# Patient Record
Sex: Male | Born: 1995
Health system: Southern US, Community
[De-identification: ages and names within clinical notes are randomized; demographics above are authoritative.]

## PROBLEM LIST (undated history)

## (undated) DIAGNOSIS — R519 Headache, unspecified: Secondary | ICD-10-CM

## (undated) DIAGNOSIS — C801 Malignant (primary) neoplasm, unspecified: Secondary | ICD-10-CM

## (undated) DIAGNOSIS — R51 Headache: Secondary | ICD-10-CM

## (undated) DIAGNOSIS — T8859XA Other complications of anesthesia, initial encounter: Secondary | ICD-10-CM

## (undated) DIAGNOSIS — T4145XA Adverse effect of unspecified anesthetic, initial encounter: Secondary | ICD-10-CM

---
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
## 2017-01-03 ENCOUNTER — Other Ambulatory Visit: Payer: Self-pay | Admitting: Pathology

## 2017-01-03 LAB — SURGICAL PATHOLOGY

## 2017-01-06 ENCOUNTER — Ambulatory Visit (INDEPENDENT_AMBULATORY_CARE_PROVIDER_SITE_OTHER): Payer: Medicaid Other | Admitting: Urology

## 2017-01-06 ENCOUNTER — Telehealth: Payer: Self-pay | Admitting: Family Medicine

## 2017-01-06 ENCOUNTER — Encounter: Payer: Self-pay | Admitting: Urology

## 2017-01-06 VITALS — BP 137/85 | HR 71 | Ht 70.0 in | Wt 212.4 lb

## 2017-01-06 DIAGNOSIS — C6212 Malignant neoplasm of descended left testis: Secondary | ICD-10-CM

## 2017-01-06 NOTE — Telephone Encounter (Signed)
Please review. Thanks!  

## 2017-01-06 NOTE — Progress Notes (Signed)
01/06/2017 11:52 AM   Manuel [name] Aug 20, 1995 [id number]  Referring provider: Birdie Sons, MD [address] [place], [place] [address]  Chief Complaint  Patient presents with  . Follow-up    Path results    HPI: The patient is a 21 year old male presents today after undergoing a left radical orchiectomy for left testicular mass.  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  Preoperative CT abdomen chest pelvis was negative.  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 Cancer ([place])   . Complication of anesthesia   . Headache    migraines    Surgical History: Past Surgical History:  Procedure Laterality Date  . ORCHIECTOMY Left 12/31/2016   Procedure: RADICAL INGUINAL ORCHIECTOMY;  Surgeon: Nickie Retort, MD;  Location: [facility] ORS;  Service: Urology;  Laterality: Left;  . TONSILLECTOMY AND ADENOIDECTOMY  2009    Home Medications:  Allergies as of 01/06/2017      Reactions   Other Other (See Comments)   Grass pollen      Medication List    as of 01/06/2017 11:52 AM   You have not been prescribed any medications.     Allergies:  Allergies  Allergen Reactions  . Other Other (See Comments)    Grass pollen    Family History: Family History  Problem Relation Age of Onset  . Healthy Mother   . Healthy Father   . Healthy Brother     Social History:  reports that he has been smoking E-cigarettes.  He has never used smokeless tobacco. He reports that he drinks alcohol. He reports that he does not use drugs.  ROS: UROLOGY Frequent Urination?: No Hard to postpone urination?: No Burning/pain with urination?: No Get up at night to urinate?: No Leakage of urine?: No Urine stream starts  and stops?: No Trouble starting stream?: No Do you have to strain to urinate?: No Blood in urine?: No Urinary tract infection?: No Sexually transmitted disease?: No Injury to kidneys or bladder?: No Painful intercourse?: No Weak stream?: No Erection problems?: No Penile pain?: No  Gastrointestinal Nausea?: No Vomiting?: No Indigestion/heartburn?: No Diarrhea?: No Constipation?: No  Constitutional Fever: No Night sweats?: Yes Weight loss?: No Fatigue?: No  Skin Skin rash/lesions?: No Itching?: No  Eyes Blurred vision?: No Double vision?: No  Ears/Nose/Throat Sore throat?: No Sinus problems?: No  Hematologic/Lymphatic Swollen glands?: No Easy bruising?: No  Cardiovascular Leg swelling?: No Chest pain?: No  Respiratory Cough?: No Shortness of breath?: No  Endocrine Excessive thirst?: No  Musculoskeletal Back pain?: No Joint pain?: No  Neurological Headaches?: No Dizziness?: No  Psychologic Depression?: No Anxiety?: No  Physical Exam: BP 137/85 (BP Location: Right Arm, Patient Position: Sitting, Cuff Size: Normal)   Pulse 71   Ht 5\' 10"  (1.778 m)   Wt 212 lb 6.4 oz (96.3 kg)   BMI 30.48 kg/m   Constitutional:  Alert and oriented, No acute distress. HEENT: [place] AT, moist mucus membranes.  Trachea midline, no masses. Cardiovascular: No clubbing, cyanosis, or edema. Respiratory: Normal respiratory effort, no increased work of breathing. GI: Abdomen is soft, nontender, nondistended, no abdominal masses GU: No CVA tenderness.  Left inguinal incision clean dry and intact.  No scrotal hematoma. Skin: No rashes, bruises or  suspicious lesions. Lymph: No cervical or inguinal adenopathy. Neurologic: Grossly intact, no focal deficits, moving all 4 extremities. Psychiatric: Normal mood and affect.  Laboratory Data: Lab Results  Component Value Date   WBC 7.8 12/28/2016   HGB 16.5 12/28/2016   HCT 49.3 12/28/2016   MCV 91 12/28/2016   PLT 240  12/28/2016    Lab Results  Component Value Date   CREATININE 0.85 12/28/2016    No results found for: PSA  No results found for: TESTOSTERONE  No results found for: HGBA1C  Urinalysis    Component Value Date/Time   COLORURINE YELLOW (A) 12/26/2016 0502   APPEARANCEUR Clear 12/27/2016 1400   LABSPEC 1.030 12/26/2016 0502   PHURINE 6.0 12/26/2016 0502   GLUCOSEU Negative 12/27/2016 1400   HGBUR NEGATIVE 12/26/2016 0502   BILIRUBINUR Negative 12/27/2016 1400   KETONESUR 20 (A) 12/26/2016 0502   PROTEINUR Negative 12/27/2016 1400   PROTEINUR 30 (A) 12/26/2016 0502   NITRITE Negative 12/27/2016 1400   NITRITE NEGATIVE 12/26/2016 0502   LEUKOCYTESUR Negative 12/27/2016 1400     Assessment & Plan:    1. 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 He does understand his final stage will depend on his postoperative tumor markers which cannot be drawn for at least another 20 days to be accurate.  If he is stage IA per NCCN guidelines, he will need either surveillance versus RPLND versus 1 cycle of BEP.  Surveillance is however preferred.  We went over the stage IA follow-up schedule which requires first follow-up for H&P and markers in 2 months and CT abdomen and pelvis at 4 months and chest x-ray at 4 months.  This to be followed by repeated routine close follow-up.  If he is stage IS meaning that his tumor markers do not normalize, he would be a candidate for BEP chemotherapy as he is considered good risk.  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  We will also arrange for this.  He will follow-up after having his tumor markers redrawn at least 25 days after surgery.   Return in about 1 month (around 02/06/2017) for Recheck tumor markers prior after at least 20 days from today. MRI prior.  Nickie Retort,  MD  [facility] [address] [place], [place] [address] [phone]

## 2017-01-06 NOTE — Telephone Encounter (Signed)
Pt's mother states she wanted to make sure you were aware the patient has been dxed with cancer and that is why he has not been able to f/u with Mikki Santee.

## 2017-01-06 NOTE — Telephone Encounter (Signed)
Tell her thanks-the records are appearing in his electronic chart.

## 2017-01-07 ENCOUNTER — Ambulatory Visit: Payer: Medicaid Other

## 2017-01-12 ENCOUNTER — Encounter: Payer: Self-pay | Admitting: Urology

## 2017-01-12 ENCOUNTER — Ambulatory Visit (INDEPENDENT_AMBULATORY_CARE_PROVIDER_SITE_OTHER): Payer: Medicaid Other | Admitting: Urology

## 2017-01-12 VITALS — BP 134/75 | HR 79 | Ht 70.0 in | Wt 214.4 lb

## 2017-01-12 DIAGNOSIS — C6212 Malignant neoplasm of descended left testis: Secondary | ICD-10-CM

## 2017-01-12 NOTE — Progress Notes (Signed)
01/12/2017 9:05 AM   Manuel [name] [birth year] [id number]  Referring provider: Birdie Sons, MD [address] [place], [place] [address]  Chief Complaint  Patient presents with  . Routine Post Op    HPI: The patient is a 21 year old gentleman who recently underwent left orchiectomy for left testicular mass.  He presents today for a wound check.  He is concerned that he felt a bulge at the incision site.  He notes the swelling was increased yesterday but has improved overnight.  He has no pain.  He does note itching of his incision.  He is having normal bowel movements and passing gas.  He has lifted heavier objects and has cleaned his house since he was last seen about a week ago.  He has no other concerns at this time.  Please see previous note dated January 06, 2017 for detailed discussion of his pathology and prognosis as well as further workup.   PMH: Past Medical History:  Diagnosis Date  . Cancer ([place])   . Complication of anesthesia   . Headache    migraines    Surgical History: Past Surgical History:  Procedure Laterality Date  . ORCHIECTOMY Left 12/31/2016   Procedure: RADICAL INGUINAL ORCHIECTOMY;  Surgeon: Nickie Retort, MD;  Location: [facility] ORS;  Service: Urology;  Laterality: Left;  . TONSILLECTOMY AND ADENOIDECTOMY  2009    Home Medications:  Allergies as of 01/12/2017      Reactions   Other Other (See Comments)   Grass pollen      Medication List    as of 01/12/2017  9:05 AM   You have not been prescribed any medications.     Allergies:  Allergies  Allergen Reactions  . Other Other (See Comments)    Grass pollen    Family History: Family History  Problem Relation Age of Onset  . Healthy Mother   . Healthy Father   . Healthy Brother     Social History:  reports that he has been smoking E-cigarettes.  He has never used smokeless tobacco. He reports that he drinks alcohol. He reports that he does not use  drugs.  ROS: UROLOGY Frequent Urination?: No Hard to postpone urination?: No Burning/pain with urination?: No Get up at night to urinate?: No Leakage of urine?: No Urine stream starts and stops?: No Trouble starting stream?: No Do you have to strain to urinate?: No Blood in urine?: No Urinary tract infection?: No Sexually transmitted disease?: No Injury to kidneys or bladder?: No Painful intercourse?: No Weak stream?: No Erection problems?: No Penile pain?: No  Gastrointestinal Nausea?: No Vomiting?: No Indigestion/heartburn?: No Diarrhea?: No Constipation?: No  Constitutional Fever: No Night sweats?: No Weight loss?: No Fatigue?: No  Skin Skin rash/lesions?: No Itching?: No  Eyes Blurred vision?: No Double vision?: No  Ears/Nose/Throat Sore throat?: No Sinus problems?: No  Hematologic/Lymphatic Swollen glands?: No Easy bruising?: No  Cardiovascular Leg swelling?: No Chest pain?: No  Respiratory Cough?: No Shortness of breath?: No  Endocrine Excessive thirst?: No  Musculoskeletal Back pain?: No Joint pain?: No  Neurological Headaches?: No Dizziness?: No  Psychologic Depression?: No Anxiety?: No  Physical Exam: BP 134/75 (BP Location: Right Arm, Patient Position: Sitting, Cuff Size: Normal)   Pulse 79   Ht 5\' 10"  (1.778 m)   Wt 214 lb 6.4 oz (97.3 kg)   BMI 30.76 kg/m   Constitutional:  Alert and oriented, No acute distress. HEENT: [place] AT, moist mucus membranes.  Trachea  midline, no masses. Cardiovascular: No clubbing, cyanosis, or edema. Respiratory: Normal respiratory effort, no increased work of breathing. GI: Abdomen is soft, nontender, nondistended, no abdominal masses GU: No CVA tenderness.  Left inguinal incision clean dry and intact.  Mild induration.  No evidence of hernia or infection. Skin: No rashes, bruises or suspicious lesions. Lymph: No cervical or inguinal adenopathy. Neurologic: Grossly intact, no focal deficits,  moving all 4 extremities. Psychiatric: Normal mood and affect.  Laboratory Data: Lab Results  Component Value Date   WBC 7.8 12/28/2016   HGB 16.5 12/28/2016   HCT 49.3 12/28/2016   MCV 91 12/28/2016   PLT 240 12/28/2016    Lab Results  Component Value Date   CREATININE 0.85 12/28/2016    No results found for: PSA  No results found for: TESTOSTERONE  No results found for: HGBA1C  Urinalysis    Component Value Date/Time   COLORURINE YELLOW (A) 12/26/2016 0502   APPEARANCEUR Clear 12/27/2016 1400   LABSPEC 1.030 12/26/2016 0502   PHURINE 6.0 12/26/2016 0502   GLUCOSEU Negative 12/27/2016 1400   HGBUR NEGATIVE 12/26/2016 0502   BILIRUBINUR Negative 12/27/2016 1400   KETONESUR 20 (A) 12/26/2016 0502   PROTEINUR Negative 12/27/2016 1400   PROTEINUR 30 (A) 12/26/2016 0502   NITRITE Negative 12/27/2016 1400   NITRITE NEGATIVE 12/26/2016 0502   LEUKOCYTESUR Negative 12/27/2016 1400    Assessment & Plan:    1.  Left testicular cancer The patient's incision is healing well.  I do not see any sign of hernia or infection at this time.  He will keep his previously scheduled appointment for post orchiectomy tumor markers in a few weeks.   Nickie Retort, MD  [facility] [address] [place], [place] [address] [phone]

## 2017-01-25 ENCOUNTER — Ambulatory Visit
Admission: RE | Admit: 2017-01-25 | Discharge: 2017-01-25 | Disposition: A | Discharge status: Home / Self Care | Payer: 59 | Source: Ambulatory Visit | Attending: Urology | Admitting: Urology

## 2017-01-25 DIAGNOSIS — C6212 Malignant neoplasm of descended left testis: Secondary | ICD-10-CM | POA: Diagnosis present

## 2017-01-25 MED ORDER — GADOBENATE DIMEGLUMINE 529 MG/ML IV SOLN
20.0000 mL | Freq: Once | INTRAVENOUS | Status: AC | PRN
  Administered 2017-01-25: 19 mL via INTRAVENOUS

## 2017-01-26 ENCOUNTER — Other Ambulatory Visit: Payer: 59

## 2017-01-26 DIAGNOSIS — C6212 Malignant neoplasm of descended left testis: Secondary | ICD-10-CM

## 2017-01-27 LAB — LACTATE DEHYDROGENASE: LDH: 171 IU/L (ref 121–224)

## 2017-01-27 LAB — AFP TUMOR MARKER: AFP, SERUM, TUMOR MARKER: 4 ng/mL (ref 0.0–8.3)

## 2017-01-27 LAB — BETA HCG QUANT (REF LAB): hCG Quant: <1 m[IU]/mL (ref 0–3)

## 2017-01-28 ENCOUNTER — Ambulatory Visit (INDEPENDENT_AMBULATORY_CARE_PROVIDER_SITE_OTHER): Payer: 59 | Admitting: Urology

## 2017-01-28 ENCOUNTER — Encounter: Payer: Self-pay | Admitting: Urology

## 2017-01-28 VITALS — BP 134/76 | HR 79 | Ht 72.0 in | Wt 214.0 lb

## 2017-01-28 DIAGNOSIS — C6212 Malignant neoplasm of descended left testis: Secondary | ICD-10-CM

## 2017-01-28 NOTE — Progress Notes (Signed)
01/28/2017 11:31 AM   Manuel [name] 1995-08-31 [id number]  Referring provider: Birdie Sons, MD [address] [place], [place] [address]  Chief Complaint  Patient presents with  . Follow-up    HPI: The patient is a 21 year old male presents today after undergoing a left radical orchiectomy for left testicular mass.  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  Preoperative CT abdomen chest pelvis was negative.  MRI brain which was ordered secondary to choriocarcinoma component was normal.  His postoperative tumor markers have now normalized.  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 Cancer ([place])   . Complication of anesthesia   . Headache    migraines    Surgical History: Past Surgical History:  Procedure Laterality Date  . RADICAL INGUINAL ORCHIECTOMY Left 12/31/2016   Performed by Nickie Retort, MD at [facility]  . TONSILLECTOMY AND ADENOIDECTOMY  2009    Home Medications:  Allergies as of 01/28/2017      Reactions   Other Other (See Comments)   Grass pollen      Medication List    as of 01/28/2017 11:31 AM   You have not been prescribed any medications.     Allergies:  Allergies  Allergen Reactions  . Other Other (See Comments)    Grass pollen    Family History: Family History  Problem Relation Age of Onset  . Healthy Mother   . Healthy Father   . Healthy Brother     Social History:  reports that he has been smoking e-cigarettes.  he has never used smokeless tobacco. He reports that he drinks alcohol. He reports that he does not use  drugs.  ROS: UROLOGY Frequent Urination?: No Hard to postpone urination?: No Burning/pain with urination?: No Get up at night to urinate?: No Leakage of urine?: No Urine stream starts and stops?: No Trouble starting stream?: No Do you have to strain to urinate?: No Blood in urine?: No Urinary tract infection?: No Sexually transmitted disease?: No Injury to kidneys or bladder?: No Painful intercourse?: No Weak stream?: No Erection problems?: No Penile pain?: No  Gastrointestinal Nausea?: No Vomiting?: No Indigestion/heartburn?: No Diarrhea?: No Constipation?: No  Constitutional Fever: No Night sweats?: No Weight loss?: No Fatigue?: No  Skin Skin rash/lesions?: No Itching?: No  Eyes Blurred vision?: No Double vision?: No  Ears/Nose/Throat Sore throat?: No Sinus problems?: No  Hematologic/Lymphatic Swollen glands?: No Easy bruising?: No  Cardiovascular Leg swelling?: No Chest pain?: No  Respiratory Cough?: No Shortness of breath?: No  Endocrine Excessive thirst?: No  Musculoskeletal Back pain?: No Joint pain?: No  Neurological Headaches?: No Dizziness?: No  Psychologic Depression?: No Anxiety?: No  Physical Exam: BP 134/76   Pulse 79   Ht 6' (1.829 m)   Wt 214 lb (97.1 kg)   BMI 29.02 kg/m   Constitutional:  Alert and oriented, No acute distress. HEENT: [place] AT, moist mucus membranes.  Trachea midline, no masses. Cardiovascular: No clubbing, cyanosis, or edema. Respiratory: Normal respiratory effort, no increased work of breathing. GI: Abdomen is soft, nontender, nondistended, no abdominal masses GU:  No CVA tenderness.  Skin: No rashes, bruises or suspicious lesions. Lymph: No cervical or inguinal adenopathy. Neurologic: Grossly intact, no focal deficits, moving all 4 extremities. Psychiatric: Normal mood and affect.  Laboratory Data: Lab Results  Component Value Date   WBC 7.8 12/28/2016   HGB 16.5 12/28/2016   HCT 49.3  12/28/2016   MCV 91 12/28/2016   PLT 240 12/28/2016    Lab Results  Component Value Date   CREATININE 0.85 12/28/2016    No results found for: PSA  No results found for: TESTOSTERONE  No results found for: HGBA1C  Urinalysis    Component Value Date/Time   COLORURINE YELLOW (A) 12/26/2016 0502   APPEARANCEUR Clear 12/27/2016 1400   LABSPEC 1.030 12/26/2016 0502   PHURINE 6.0 12/26/2016 0502   GLUCOSEU Negative 12/27/2016 1400   HGBUR NEGATIVE 12/26/2016 0502   BILIRUBINUR Negative 12/27/2016 1400   KETONESUR 20 (A) 12/26/2016 0502   PROTEINUR Negative 12/27/2016 1400   PROTEINUR 30 (A) 12/26/2016 0502   NITRITE Negative 12/27/2016 1400   NITRITE NEGATIVE 12/26/2016 0502   LEUKOCYTESUR Negative 12/27/2016 1400    Pertinent Imaging: MRI brain reviewed without evidence of disease  Assessment & Plan:    1. 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  He will need to follow-up in 2 months for an H&P and markers.  He will need an abdominal and pelvic CT every 4-6 months for the first year.  He will need a chest x-ray at 4 in 12 months.  His follow-up intervals will increase after 1 year per NCCN guidelines.  Return in about 2 months (around 03/30/2017) for afp, hcg, ldh prior.  Nickie Retort, MD  [facility] [address], [place], [place] [address] [phone]

## 2017-04-01 ENCOUNTER — Other Ambulatory Visit: Payer: 59

## 2017-04-01 DIAGNOSIS — C6212 Malignant neoplasm of descended left testis: Secondary | ICD-10-CM

## 2017-04-02 LAB — AFP TUMOR MARKER: AFP, Serum, Tumor Marker: 1.7 ng/mL (ref 0.0–8.3)

## 2017-04-02 LAB — BETA HCG QUANT (REF LAB): hCG Quant: <1 m[IU]/mL (ref 0–3)

## 2017-04-02 LAB — LACTATE DEHYDROGENASE: LDH: 164 IU/L (ref 121–224)

## 2017-04-07 ENCOUNTER — Ambulatory Visit: Payer: 59 | Admitting: Urology

## 2017-04-14 ENCOUNTER — Ambulatory Visit: Payer: 59 | Admitting: Urology

## 2017-04-14 ENCOUNTER — Encounter: Payer: Self-pay | Admitting: Urology

## 2017-04-29 ENCOUNTER — Encounter: Payer: Self-pay | Admitting: Urology

## 2017-04-29 ENCOUNTER — Ambulatory Visit (INDEPENDENT_AMBULATORY_CARE_PROVIDER_SITE_OTHER): Payer: 59 | Admitting: Urology

## 2017-04-29 VITALS — BP 124/79 | HR 65 | Ht 72.0 in | Wt 212.9 lb

## 2017-04-29 DIAGNOSIS — C6212 Malignant neoplasm of descended left testis: Secondary | ICD-10-CM

## 2017-04-29 NOTE — Progress Notes (Signed)
04/29/2017 1:29 PM   Vershawn A Martinique [birth year] [id number]  Referring provider: Birdie Sons, MD [address] [place], [place] [address]  Chief Complaint  Patient presents with  . Follow-up    HPI: The patient is a 22 year old male presents today for first tumor marker follow up after undergoing a left radical orchiectomy for left testicular mass. 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 Preoperative CT abdomen chest pelvis was negative.  MRI brain which was ordered secondary to small choriocarcinoma component which was normal.  His preoperative tumor markers were elevated but subsequent postoperative tumor markers normalized.  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   PMH: Past Medical History:  Diagnosis Date  . Cancer ([place])   . Complication of anesthesia   . Headache    migraines    Surgical History: Past Surgical History:  Procedure Laterality Date  . ORCHIECTOMY Left 12/31/2016   Procedure: RADICAL INGUINAL ORCHIECTOMY;  Surgeon: Nickie Retort, MD;  Location: [facility] ORS;  Service: Urology;  Laterality: Left;  . TONSILLECTOMY AND ADENOIDECTOMY  2009    Home Medications:  Allergies as of 04/29/2017      Reactions   Other Other (See Comments)   Grass pollen      Medication List    as of 04/29/2017  1:29 PM   You have not been prescribed any medications.      Allergies:  Allergies  Allergen Reactions  . Other Other (See Comments)    Grass pollen    Family History: Family History  Problem Relation Age of Onset  . Healthy Mother   . Healthy Father   . Healthy Brother     Social History:  reports that he has been smoking e-cigarettes.  he has never used smokeless tobacco. He reports that he drinks alcohol. He reports that he does not use drugs.  ROS: UROLOGY Frequent Urination?: No Hard to postpone urination?: No Burning/pain with urination?: No Get up at night to urinate?: No Leakage of urine?: No Urine stream starts and stops?: No Trouble starting stream?: No Do you have to strain to urinate?: No Blood in urine?: No Urinary tract infection?: No Sexually transmitted disease?: No Injury to kidneys or bladder?: No Painful intercourse?: No Weak stream?: No Erection problems?: No Penile pain?: No  Gastrointestinal Nausea?: No Vomiting?: No Indigestion/heartburn?: No Diarrhea?: No Constipation?: No  Constitutional Fever: No Night sweats?: No Weight loss?: No Fatigue?: No  Skin Skin rash/lesions?: No Itching?: No  Eyes Blurred vision?: No Double vision?: No  Ears/Nose/Throat Sore throat?: No Sinus problems?: No  Hematologic/Lymphatic Swollen glands?: No Easy bruising?: No  Cardiovascular Leg swelling?: No Chest pain?: No  Respiratory Cough?: No Shortness of breath?: No  Endocrine Excessive thirst?: No  Musculoskeletal Back pain?: No Joint pain?: No  Neurological Headaches?: No Dizziness?: No  Psychologic Depression?: No Anxiety?: No  Physical Exam: BP 124/79 (BP Location: Right Arm, Patient Position: Sitting, Cuff  Size: Large)   Pulse 65   Ht 6' (1.829 m)   Wt 212 lb 14.4 oz (96.6 kg)   BMI 28.87 kg/m   Constitutional:  Alert and oriented, No acute distress. HEENT: [place] AT, moist mucus membranes.  Trachea midline, no masses. Cardiovascular: No clubbing, cyanosis, or  edema. Respiratory: Normal respiratory effort, no increased work of breathing. GI: Abdomen is soft, nontender, nondistended, no abdominal masses GU: No CVA tenderness.  Left inguinal incision well-healed.  Normal right testicle without masses descended.  No inguinal lymphadenopathy.  No palpable abdominal masses.  No abnormalities on exam. Skin: No rashes, bruises or suspicious lesions. Lymph: No cervical or inguinal adenopathy. Neurologic: Grossly intact, no focal deficits, moving all 4 extremities. Psychiatric: Normal mood and affect.  Laboratory Data: Lab Results  Component Value Date   WBC 7.8 12/28/2016   HGB 16.5 12/28/2016   HCT 49.3 12/28/2016   MCV 91 12/28/2016   PLT 240 12/28/2016    Lab Results  Component Value Date   CREATININE 0.85 12/28/2016    No results found for: PSA  No results found for: TESTOSTERONE  No results found for: HGBA1C  Urinalysis    Component Value Date/Time   COLORURINE YELLOW (A) 12/26/2016 0502   APPEARANCEUR Clear 12/27/2016 1400   LABSPEC 1.030 12/26/2016 0502   PHURINE 6.0 12/26/2016 0502   GLUCOSEU Negative 12/27/2016 1400   HGBUR NEGATIVE 12/26/2016 0502   BILIRUBINUR Negative 12/27/2016 1400   KETONESUR 20 (A) 12/26/2016 0502   PROTEINUR Negative 12/27/2016 1400   PROTEINUR 30 (A) 12/26/2016 0502   NITRITE Negative 12/27/2016 1400   NITRITE NEGATIVE 12/26/2016 0502   LEUKOCYTESUR Negative 12/27/2016 1400    Assessment & Plan:    1. 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  He will need to follow-up in 2 months for an H&P and markers.  He will need an abdominal and pelvic CT every 4-6 months for the first year.  He will need a chest x-ray at 4 in 12 months. He will have both of these imaging studies prior to follow up as well since this will be the 4 month mark.  His follow-up intervals will increase after 1 year per NCCN guidelines.  I  again stressed with the patient the importance of regular follow-up to detect any early recurrence.   Return in about 2 months (around 06/27/2017) for labs, CT, chest xray prior.  Nickie Retort, MD  [facility] [address] [place], [place] [address] [phone]

## 2017-07-04 ENCOUNTER — Ambulatory Visit
Admission: RE | Admit: 2017-07-04 | Discharge: 2017-07-04 | Disposition: A | Discharge status: Home / Self Care | Payer: 59 | Source: Ambulatory Visit | Attending: Urology | Admitting: Urology

## 2017-07-04 DIAGNOSIS — C6212 Malignant neoplasm of descended left testis: Secondary | ICD-10-CM

## 2017-07-04 DIAGNOSIS — Z9079 Acquired absence of other genital organ(s): Secondary | ICD-10-CM | POA: Diagnosis not present

## 2017-07-04 MED ORDER — IOPAMIDOL (ISOVUE-300) INJECTION 61%
100.0000 mL | Freq: Once | INTRAVENOUS | Status: AC | PRN
  Administered 2017-07-04: 100 mL via INTRAVENOUS

## 2017-07-08 ENCOUNTER — Ambulatory Visit: Payer: 59

## 2017-07-22 ENCOUNTER — Encounter: Payer: Self-pay | Admitting: Urology

## 2017-07-22 ENCOUNTER — Ambulatory Visit (INDEPENDENT_AMBULATORY_CARE_PROVIDER_SITE_OTHER): Payer: 59 | Admitting: Urology

## 2017-07-22 VITALS — BP 121/75 | HR 73 | Ht 70.0 in | Wt 211.0 lb

## 2017-07-22 DIAGNOSIS — C6290 Malignant neoplasm of unspecified testis, unspecified whether descended or undescended: Secondary | ICD-10-CM | POA: Diagnosis not present

## 2017-07-22 DIAGNOSIS — R911 Solitary pulmonary nodule: Secondary | ICD-10-CM

## 2017-07-22 NOTE — Progress Notes (Signed)
07/22/2017 3:49 PM   Manuel [name] [birth year] [id number]  Referring provider: Birdie Sons, MD [address] [place], [place] [address]  Chief Complaint  Patient presents with  . Follow-up    ct scan and x-ray    HPI: The patient is a 22 year old male presents today for follow up after undergoing a left radical orchiectomy for left testicular mass. 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 Preoperative CT abdomen chest pelvis was negative.MRI brain which was orderedsecondary to small choriocarcinoma component was normal. His preoperative tumor markers were elevated but subsequent postoperative tumor markers normalized. This makes him a stage IAnonseminoma mixed germ cell tumor.  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  Today he follows up for his 71-month visit.  He underwent a CT abdomen which is unremarkable.  The chest x-ray did show an indeterminate 1.3 cm nodular density in the left lower lobe, and a CT of the chest was recommended by the radiologist.  His tumor markers for today's visit have not been drawn yet.  He has no complaints at this time.  He has lost about 10 pounds purposely by working out.  He still has a good appetite.  No lower extremity swelling, abdominal fullness, or pain.  No shortness of breath.  Patient notes no issues with erectile function or libido.  He reports that he is doing well overall.    PMH: Past Medical  History:  Diagnosis Date  . Cancer ([place])    testicular   . Complication of anesthesia   . Headache    migraines    Surgical History: Past Surgical History:  Procedure Laterality Date  . ORCHIECTOMY Left 12/31/2016   Procedure: RADICAL INGUINAL ORCHIECTOMY;  Surgeon: Nickie Retort, MD;  Location: [facility] ORS;  Service: Urology;  Laterality: Left;  . TONSILLECTOMY AND ADENOIDECTOMY  2009    Home Medications:  Allergies as of 07/22/2017      Reactions   Other Other (See Comments)   Grass pollen      Medication List    as of 07/22/2017  3:49 PM   You have not been prescribed any medications.     Allergies:  Allergies  Allergen Reactions  . Other Other (See Comments)    Grass pollen    Family History: Family History  Problem Relation Age of Onset  . Healthy Mother   . Healthy Father   . Healthy Brother     Social History:  reports that he has been smoking e-cigarettes.  He has never used smokeless tobacco. He reports that he drinks alcohol. He reports that he does not use drugs.  ROS: UROLOGY Frequent Urination?: No Hard to postpone urination?: No Burning/pain with urination?: No Get up at night to urinate?: No Leakage of urine?: No Urine stream starts and stops?: No Trouble starting stream?: No Do you have to strain to urinate?: No Blood in urine?: No Urinary tract infection?: No Sexually transmitted disease?: No Injury to kidneys or bladder?: No Painful intercourse?: No Weak  stream?: No Erection problems?: No Penile pain?: No  Gastrointestinal Nausea?: No Vomiting?: No Indigestion/heartburn?: No Diarrhea?: No Constipation?: No  Constitutional Fever: No Night sweats?: No Weight loss?: No Fatigue?: No  Skin Skin rash/lesions?: No Itching?: No  Eyes Blurred vision?: No Double vision?: No  Ears/Nose/Throat Sore throat?: No Sinus problems?: No  Hematologic/Lymphatic Swollen glands?: No Easy bruising?: No  Cardiovascular Leg  swelling?: No Chest pain?: No  Respiratory Cough?: No Shortness of breath?: No  Endocrine Excessive thirst?: No  Musculoskeletal Back pain?: No Joint pain?: No  Neurological Headaches?: No Dizziness?: No  Psychologic Depression?: No Anxiety?: No  Physical Exam: BP 121/75   Pulse 73   Ht 5\' 10"  (1.778 m)   Wt 211 lb (95.7 kg)   BMI 30.28 kg/m   Constitutional:  Alert and oriented, No acute distress. HEENT: [place] AT, moist mucus membranes.  Trachea midline, no masses. Cardiovascular: No clubbing, cyanosis, or edema. Respiratory: Normal respiratory effort, no increased work of breathing. GI: Abdomen is soft, nontender, nondistended, no abdominal masses GU: No CVA tenderness.  Normal phallus.  Right testicle descended.  Benign without nodules.  Left inguinal incision palpably normal.  Left hemiscrotum is vacant with no abnormalities.  No palpable lymphadenopathy in the groin region. Skin: No rashes, bruises or suspicious lesions. Lymph: No cervical or inguinal adenopathy. Neurologic: Grossly intact, no focal deficits, moving all 4 extremities. Psychiatric: Normal mood and affect.  Laboratory Data: Lab Results  Component Value Date   WBC 7.8 12/28/2016   HGB 16.5 12/28/2016   HCT 49.3 12/28/2016   MCV 91 12/28/2016   PLT 240 12/28/2016    Lab Results  Component Value Date   CREATININE 0.85 12/28/2016    No results found for: PSA  No results found for: TESTOSTERONE  No results found for: HGBA1C  Urinalysis    Component Value Date/Time   COLORURINE YELLOW (A) 12/26/2016 0502   APPEARANCEUR Clear 12/27/2016 1400   LABSPEC 1.030 12/26/2016 0502   PHURINE 6.0 12/26/2016 0502   GLUCOSEU Negative 12/27/2016 1400   HGBUR NEGATIVE 12/26/2016 0502   BILIRUBINUR Negative 12/27/2016 1400   KETONESUR 20 (A) 12/26/2016 0502   PROTEINUR Negative 12/27/2016 1400   PROTEINUR 30 (A) 12/26/2016 0502   NITRITE Negative 12/27/2016 1400   NITRITE NEGATIVE 12/26/2016 0502     LEUKOCYTESUR Negative 12/27/2016 1400    Pertinent Imaging: CT abdomen/pelvis and chest x-ray images and report reviewed as above.  Assessment & Plan:    1. Stage 1Anonseminoma mixed germ cell tumor The patient will continue active surveillance of his stage Ia nonseminomatous germ cell tumor.  He is due for tumor markers which will be drawn today. He will need to follow-up in 2 months for H&P and tumor markers. He will need an abdominal and pelvic CT every 4-6 months for the first year.  Due to the small nodule on chest x-ray, we will have him undergo a CT of his chest at this time.  Assuming this is normal,he will need a chest x-ray at 12 months. His follow-up intervals will increase after 1 year per NCCNguidelines.   Return in about 2 months (around 09/21/2017) for AFP, HCG, LDH prior.  Nickie Retort, MD  [facility] [address] [place], [place] [address] [phone]

## 2017-07-23 LAB — BETA HCG QUANT (REF LAB)

## 2017-07-23 LAB — AFP TUMOR MARKER: AFP, SERUM, TUMOR MARKER: 1.3 ng/mL (ref 0.0–8.3)

## 2017-07-23 LAB — LACTATE DEHYDROGENASE: LDH: 185 IU/L (ref 121–224)

## 2017-07-25 ENCOUNTER — Telehealth: Payer: Self-pay

## 2017-07-25 NOTE — Telephone Encounter (Signed)
-----   Message from Nickie Retort, MD sent at 07/25/2017 12:43 PM EDT ----- Please let patient know that his blood work showed no evidence of cancer. Thanks.   ----- Message ----- From: Royanne Foots, CMA Sent: 07/25/2017   8:27 AM To: Nickie Retort, MD    ----- Message ----- From: Interface, [facility] Lab Results In Sent: 07/23/2017   5:41 AM To: [facility]

## 2017-07-25 NOTE — Telephone Encounter (Signed)
Patient notified

## 2017-08-02 ENCOUNTER — Ambulatory Visit: Payer: 59 | Admitting: Urology

## 2017-08-05 ENCOUNTER — Ambulatory Visit
Admission: RE | Admit: 2017-08-05 | Discharge: 2017-08-05 | Disposition: A | Discharge status: Home / Self Care | Payer: 59 | Source: Ambulatory Visit | Attending: Urology | Admitting: Urology

## 2017-08-05 DIAGNOSIS — R911 Solitary pulmonary nodule: Secondary | ICD-10-CM

## 2017-08-05 DIAGNOSIS — R918 Other nonspecific abnormal finding of lung field: Secondary | ICD-10-CM | POA: Diagnosis not present

## 2017-08-05 MED ORDER — IOPAMIDOL (ISOVUE-300) INJECTION 61%
75.0000 mL | Freq: Once | INTRAVENOUS | Status: AC | PRN
  Administered 2017-08-05: 75 mL via INTRAVENOUS

## 2017-08-15 ENCOUNTER — Telehealth: Payer: Self-pay | Admitting: Urology

## 2017-08-15 NOTE — Telephone Encounter (Signed)
Patient notified

## 2017-08-15 NOTE — Telephone Encounter (Signed)
Patient's mom called asking for his CT results. She is on his DPR, they have been waiting for a while. He is Dr. Carlynn [name] patient and he was going to call with results. Patient has been throwing up blood and he is going to see his PCP this week. They just wanted to make [place] aware of this. Can you please call her or have the nurse call her at [phone] her name is Levada Dy.   Thanks, Sharyn Lull

## 2017-08-15 NOTE — Telephone Encounter (Signed)
Chest CT showed no abnormalities.  The nodule that was noted on his chest x-ray was not seen on the CT and the CT is a much more sensitive test.  Keep follow-up as scheduled.  See contact info below.

## 2017-08-16 ENCOUNTER — Ambulatory Visit (INDEPENDENT_AMBULATORY_CARE_PROVIDER_SITE_OTHER): Payer: 59 | Admitting: Family Medicine

## 2017-08-16 ENCOUNTER — Encounter: Payer: Self-pay | Admitting: Family Medicine

## 2017-08-16 VITALS — BP 102/68 | HR 60 | Temp 98.2°F | Resp 16 | Wt 203.2 lb

## 2017-08-16 DIAGNOSIS — R1013 Epigastric pain: Secondary | ICD-10-CM | POA: Diagnosis not present

## 2017-08-16 MED ORDER — DICYCLOMINE HCL 20 MG PO TABS: 20.0000 mg | ORAL_TABLET | Freq: Four times a day (QID) | ORAL | 0 refills | Status: DC

## 2017-08-16 NOTE — Progress Notes (Signed)
  Subjective:     Patient ID: Manuel [name], male   DOB: 1996-01-03, 22 y.o.   MRN: [id number] Chief Complaint  Patient presents with  . Abdominal Pain    Paitent comes in office today with concerns of upper abdominal pain x1 week. Patient descibes pain as burning and intermittent, there is no known trigger to pain and patient states that it is not relieved by anything. Patient reports that he has been under more stress but is unsure if related, patient states that he did have a episode of vomiting with blood mixed.    HPI Reports that he broke up with his girl friend before his symptoms began He is back with his old roommate and is starting to feel better. States appetite remains good and burning pain is neither triggered or ameliorated by meals. No change in bowel pattern. Currently followed by urology for testicular cancer (surgery only).  Review of Systems     Objective:   Physical Exam  Constitutional: He appears well-developed and well-nourished. No distress.  Abdominal: Bowel sounds are normal. There is no tenderness ( patient localized when sore to his epigastric area).       Assessment:    1. Epigastric pain: start Prevacid 15 mg. Two pills daily for 2-4 weeks. - dicyclomine (BENTYL) 20 MG: tablet; Take 1 tablet (20 mg total) by mouth every 6 (six) hours. As needed for stomach pain  Dispense: 20 tablet; Refill: 0    Plan:    Further f/u if not improving

## 2017-08-16 NOTE — Patient Instructions (Addendum)
Discussed taking two Prevacid daily for 2-4 weeks. May add the dicyclomine for abdominal pain/cramping. Let me know if not improving or new symptoms.

## 2017-09-21 ENCOUNTER — Other Ambulatory Visit: Payer: Self-pay

## 2017-09-23 ENCOUNTER — Ambulatory Visit: Payer: Self-pay

## 2017-09-27 ENCOUNTER — Other Ambulatory Visit: Payer: 59

## 2017-09-27 DIAGNOSIS — C6290 Malignant neoplasm of unspecified testis, unspecified whether descended or undescended: Secondary | ICD-10-CM

## 2017-09-28 LAB — BETA HCG QUANT (REF LAB): hCG Quant: <1 m[IU]/mL (ref 0–3)

## 2017-09-28 LAB — LACTATE DEHYDROGENASE: LDH: 162 IU/L (ref 121–224)

## 2017-09-28 LAB — AFP TUMOR MARKER: AFP, SERUM, TUMOR MARKER: 1.2 ng/mL (ref 0.0–8.3)

## 2017-10-11 ENCOUNTER — Ambulatory Visit: Payer: Self-pay | Admitting: Urology

## 2017-10-25 ENCOUNTER — Ambulatory Visit (INDEPENDENT_AMBULATORY_CARE_PROVIDER_SITE_OTHER): Payer: 59 | Admitting: Urology

## 2017-10-25 ENCOUNTER — Encounter: Payer: Self-pay | Admitting: Urology

## 2017-10-25 VITALS — BP 158/84 | HR 89 | Wt 200.0 lb

## 2017-10-25 DIAGNOSIS — C801 Malignant (primary) neoplasm, unspecified: Secondary | ICD-10-CM

## 2017-10-25 NOTE — Progress Notes (Signed)
10/25/2017 4:36 PM   Manuel [name] Oct 20, 1995 [id number]  Referring provider: Birdie Sons, [place] [address] [place], [place] [address]  Chief Complaint  Patient presents with  . Testicular Cancer    2 month follow up    HPI: 22 year old male with a personal history of mixed germ cell testicular cancer status post orchiectomy 12/2016 he returns today for routine surveillance.  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  He has been doing regular self testicular exams are unremarkable.  Most tumor markers in 09/2017 negative.    Most recent cross-sectional imaging on 07/04/2017 in the form of CT abdomen pelvis with contrast was negative for any metastatic disease.  He also had a possible nodule on chest x-ray at that same time and underwent follow-up chest CT on 08/05/2017 which is unremarkable.   PMH: Past Medical History:  Diagnosis Date  . Cancer ([place])    testicular   . Complication of anesthesia   . Headache    migraines    Surgical History: Past Surgical History:  Procedure Laterality Date  . ORCHIECTOMY Left 12/31/2016   Procedure: RADICAL INGUINAL ORCHIECTOMY;  Surgeon: Nickie Retort, MD;  Location: [facility] ORS;  Service: Urology;  Laterality: Left;  . TONSILLECTOMY AND ADENOIDECTOMY  2009    Home Medications:  Allergies as of 10/25/2017      Reactions   Other Other (See Comments)   Grass pollen      Medication List    as of 10/25/2017 11:59 PM   You have not been prescribed any medications.     Allergies:  Allergies  Allergen Reactions  .  Other Other (See Comments)    Grass pollen    Family History: Family History  Problem Relation Age of Onset  . Healthy Mother   . Healthy Father   . Healthy Brother     Social History:  reports that he quit smoking about 5 months ago. His smoking use included e-cigarettes. He has never used smokeless tobacco. He reports that he drinks alcohol. He reports that he does not use drugs.  ROS: UROLOGY Frequent Urination?: No Hard to postpone urination?: No Burning/pain with urination?: No Get up at night to urinate?: No Leakage of urine?: No Urine stream starts and stops?: No Trouble starting stream?: No Do you have to strain to urinate?: No Blood in urine?: No Urinary tract infection?: No Sexually transmitted disease?: No Injury to kidneys or bladder?: No Painful intercourse?: No Weak stream?: No Erection problems?: No Penile pain?: No  Gastrointestinal Nausea?: No Vomiting?: No Indigestion/heartburn?: No Diarrhea?: No Constipation?: No  Constitutional Fever: No Night sweats?: No Weight loss?: No Fatigue?: No  Skin Skin rash/lesions?: No Itching?: No  Eyes Blurred vision?: No Double vision?: No  Ears/Nose/Throat Sore throat?: No Sinus problems?: No  Hematologic/Lymphatic Swollen glands?: No Easy bruising?: No  Cardiovascular Leg swelling?: No Chest pain?: No  Respiratory Cough?: No Shortness of breath?: No  Endocrine Excessive thirst?: No  Musculoskeletal Back pain?: No Joint pain?: No  Neurological Headaches?: No Dizziness?: No  Psychologic Depression?: No Anxiety?: No  Physical Exam: BP Marland Kitchen)  158/84   Pulse 89   Wt 200 lb (90.7 kg)   BMI 28.70 kg/m   Constitutional:  Alert and oriented, No acute distress. HEENT: [place] AT, moist mucus membranes.  Trachea midline, no masses. Cardiovascular: No clubbing, cyanosis, or edema. Respiratory: Normal respiratory effort, no increased work of breathing. GI: Abdomen is soft, nontender,  nondistended, no abdominal masses.  Left subinguinal incision healing well. GU: Left testicle surgically absent.  Right testicle normal without masses or lesions.  Lymph: No inguinal lymphadenopathy. Skin: No rashes, bruises or suspicious lesions. Neurologic: Grossly intact, no focal deficits, moving all 4 extremities. Psychiatric: Normal mood and affect.  Laboratory Data: Lab Results  Component Value Date   WBC 7.8 12/28/2016   HGB 16.5 12/28/2016   HCT 49.3 12/28/2016   MCV 91 12/28/2016   PLT 240 12/28/2016    Lab Results  Component Value Date   CREATININE 0.85 12/28/2016   Labs 09/27/2017 AFP 1.0 Beta-hCG less than 1 LDH 12   Urinalysis N/a  Pertinent Imaging: CT abdomen pelvis with contrast as well as CT chest with contrast from 07/04/2017 as well as 08/05/2017 personally reviewed today.  No evidence of metastatic disease.  Assessment & Plan:    1. Stage 1Anonseminoma mixed germ cell tumor Up-to-date on active surveillance CT scan and labs recently reviewed Physical exam today is unremarkable He will be due for chest x-ray/CT scan again in 2 months as well as tumor markers Reviewed surveillance protocol as below  Return in about 2 months (around 12/25/2017) for  CT/ CXR  and tumor marker for in 2 months .  Hollice Espy, MD  [facility] [address], [place], [place] [address] [phone]

## 2017-12-27 ENCOUNTER — Ambulatory Visit: Payer: 59 | Admitting: Urology

## 2017-12-27 ENCOUNTER — Other Ambulatory Visit: Payer: 59

## 2017-12-27 ENCOUNTER — Encounter: Payer: Self-pay | Admitting: Urology

## 2018-01-05 ENCOUNTER — Ambulatory Visit: Admission: RE | Admit: 2018-01-05 | Payer: 59 | Source: Ambulatory Visit

## 2018-01-19 ENCOUNTER — Ambulatory Visit: Payer: 59 | Admitting: Urology

## 2018-01-24 ENCOUNTER — Ambulatory Visit: Payer: 59 | Admitting: Urology

## 2018-01-24 ENCOUNTER — Encounter: Payer: Self-pay | Admitting: Urology

## 2018-01-25 ENCOUNTER — Telehealth: Payer: Self-pay | Admitting: Urology

## 2018-01-25 NOTE — Telephone Encounter (Signed)
This patient was a no-show again today for clinic.  He is now failed to come to clinic 3 times in a row after being personally called each time, reminded of appointments, and assured [place] that he would come in.  He is also failed to have his imaging studies or lab work.  I am extremely concerned about this patient which was expressed to him.  He is at risk for recurrence and progression without proper surveillance.  At this point time, he is no longer a good candidate for active surveillance given his inability to comply with this.  I would like to discuss adjuvant treatment with him.  Please reach out to him again to see if he would like to reschedule.  Please reiterate the above message.  Hollice Espy, MD

## 2018-01-26 NOTE — Telephone Encounter (Signed)
Patient contacted and provided number for urology.

## 2018-01-26 NOTE — Telephone Encounter (Signed)
I just spoke with Liane Comber and he has assured me again that he will be here on Tuesday 01-31-18 @ 4:00 for his app with Dr. Erlene Quan. I tried to see about getting his CT scan prior due to the PA expiring on the 21st but he was unable to. Worse case I can contact the insurance and get an extension on that. I stress again the importance of him coming.   Thanks, Sharyn Lull

## 2018-01-30 ENCOUNTER — Telehealth: Payer: Self-pay | Admitting: Family Medicine

## 2018-01-30 NOTE — Telephone Encounter (Signed)
Pt's mother called stating she is returning a call to [place].

## 2018-01-31 ENCOUNTER — Ambulatory Visit (INDEPENDENT_AMBULATORY_CARE_PROVIDER_SITE_OTHER): Payer: 59 | Admitting: Urology

## 2018-01-31 ENCOUNTER — Encounter: Payer: Self-pay | Admitting: Urology

## 2018-01-31 DIAGNOSIS — C801 Malignant (primary) neoplasm, unspecified: Secondary | ICD-10-CM | POA: Diagnosis not present

## 2018-01-31 NOTE — Progress Notes (Signed)
01/31/2018 4:24 PM   Manuel [name] February 14, 1996 [id number]  Referring provider: Birdie Sons, MD [address] [place], [place] [address]  Chief Complaint  Patient presents with  . 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  He has no showed for 3 serial appointments and has not yet gotten his CT scan.  He reports that he has had significant emotional and psychosocial issues over the past several months.  His father who is a quadriplegic and illegal alien divorced his mother and returned back to [place].  He is no longer lot back in the country.  This is been devastating to [name] who describes his dad as "his everything".  He is tearful today as he discusses this.   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  He has been doing regular self testicular exams are unremarkable.  Is unchanged from last visit.  Most tumor markers in 09/2017 negative.    Markers repeated today.  Most recent cross-sectional imaging on 07/04/2017 in the form of CT abdomen pelvis with contrast was negative for any metastatic disease.  He also had a possible nodule on chest x-ray at that same time and underwent follow-up chest CT on 08/05/2017 which is unremarkable.   PMH: Past Medical History:  Diagnosis Date  . Cancer ([place])    testicular   . Complication of anesthesia   . Headache    migraines    Surgical History: Past Surgical History:    Procedure Laterality Date  . ORCHIECTOMY Left 12/31/2016   Procedure: RADICAL INGUINAL ORCHIECTOMY;  Surgeon: Nickie Retort, MD;  Location: [facility] ORS;  Service: Urology;  Laterality: Left;  . TONSILLECTOMY AND ADENOIDECTOMY  2009    Home Medications:  Allergies as of 01/31/2018      Reactions   Other Other (See Comments)   Grass pollen      Medication List    as of 01/31/2018  4:24 PM   You have not been prescribed any medications.     Allergies:  Allergies  Allergen Reactions  . Other Other (See Comments)    Grass pollen    Family History: Family History  Problem Relation Age of Onset  . Healthy Mother   . Healthy Father   . Healthy Brother     Social History:  reports that he quit smoking about 8 months ago. His smoking use included e-cigarettes. He has never used smokeless tobacco. He reports that he drinks alcohol. He reports that he does not use drugs.  ROS: UROLOGY Frequent Urination?: No Hard to postpone urination?: No Burning/pain with urination?: No Get up at night to urinate?: No Leakage of urine?: No Urine stream starts and stops?: No Trouble starting stream?: No Do you have to strain to urinate?: No Blood in urine?: No Urinary tract infection?: No Sexually transmitted disease?: No Injury to kidneys or bladder?: No Painful intercourse?: No Weak stream?: No Erection problems?: No Penile pain?: No  Gastrointestinal Nausea?: No  Vomiting?: No Indigestion/heartburn?: No Diarrhea?: No Constipation?: No  Constitutional Fever: No Night sweats?: No Weight loss?: No Fatigue?: No  Skin Skin rash/lesions?: No Itching?: No  Eyes Blurred vision?: No Double vision?: No  Ears/Nose/Throat Sore throat?: No Sinus problems?: No  Hematologic/Lymphatic Swollen glands?: No Easy bruising?: No  Cardiovascular Leg swelling?: No Chest pain?: No  Respiratory Cough?: No Shortness of breath?: No  Endocrine Excessive thirst?:  No  Musculoskeletal Back pain?: No Joint pain?: No  Neurological Headaches?: No Dizziness?: No  Psychologic Depression?: No Anxiety?: No  Physical Exam: BP 111/67   Pulse 76   Wt 206 lb (93.4 kg)   BMI 29.56 kg/m   Constitutional:  Alert and oriented, No acute distress. HEENT: [place] AT, moist mucus membranes.  Trachea midline, no masses. Cardiovascular: No clubbing, cyanosis, or edema. Respiratory: Normal respiratory effort, no increased work of breathing. GI: Abdomen is soft, nontender, nondistended, no abdominal masses GU: Normal solitary descended right testicle.  No masses.  Left testicle surgically absent.  Left inguinal incision well-healed. Lymph: No  inguinal lymphadenopathy. Skin: No rashes, bruises or suspicious lesions. Neurologic: Grossly intact, no focal deficits, moving all 4 extremities. Psychiatric: Normal mood and affect.  Laboratory Data: Lab Results  Component Value Date   WBC 7.8 12/28/2016   HGB 16.5 12/28/2016   HCT 49.3 12/28/2016   MCV 91 12/28/2016   PLT 240 12/28/2016    Lab Results  Component Value Date   CREATININE 0.85 12/28/2016   .  Assessment & Plan:    1. 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  We discussed risks/ benefits of each.  He was offered a referral to medical oncology as well as [facility] urology to discuss surgery further which cannot be done robotically which he declined.  He was able to verbalize the importance of timely follow-up.  He was offered emotional support  today.  Tumor markers drawn today.  He will obtain a CT scan later this week and we will call him with his results.  Based on surveillance protocol as below, he will be due for repeat imaging in 6 months, repeat exam with tumor markers in 3 months.  Patient understands this and is agreeable.  - Lactate dehydrogenase - AFP tumor marker - Beta hCG quant (ref lab)   Return in about 6 months (around 08/01/2018) for CT scan/ CXR.  Hollice Espy, MD  [facility] [address] [place], [place] [address] [phone]

## 2018-01-31 NOTE — Telephone Encounter (Signed)
See below. KW 

## 2018-02-01 ENCOUNTER — Telehealth: Payer: Self-pay | Admitting: *Deleted

## 2018-02-01 LAB — BETA HCG QUANT (REF LAB)

## 2018-02-01 LAB — LACTATE DEHYDROGENASE: LDH: 166 IU/L (ref 121–224)

## 2018-02-01 LAB — AFP TUMOR MARKER: AFP, SERUM, TUMOR MARKER: 1.5 ng/mL (ref 0.0–8.3)

## 2018-02-01 NOTE — Telephone Encounter (Signed)
-----   Message from Hollice Espy, MD sent at 02/01/2018  8:23 AM EST ----- Tumor markers are negative.  Awesome news!  I will call you with your CT scan results later this week.    Hollice Espy, MD

## 2018-02-02 ENCOUNTER — Ambulatory Visit
Admission: RE | Admit: 2018-02-02 | Discharge: 2018-02-02 | Disposition: A | Discharge status: Home / Self Care | Payer: 59 | Source: Ambulatory Visit | Attending: Urology | Admitting: Urology

## 2018-02-02 DIAGNOSIS — C801 Malignant (primary) neoplasm, unspecified: Secondary | ICD-10-CM

## 2018-02-02 DIAGNOSIS — Z8547 Personal history of malignant neoplasm of testis: Secondary | ICD-10-CM | POA: Diagnosis not present

## 2018-02-02 DIAGNOSIS — K760 Fatty (change of) liver, not elsewhere classified: Secondary | ICD-10-CM | POA: Diagnosis not present

## 2018-02-02 MED ORDER — IOPAMIDOL (ISOVUE-300) INJECTION 61%
100.0000 mL | Freq: Once | INTRAVENOUS | Status: AC | PRN
  Administered 2018-02-02: 100 mL via INTRAVENOUS

## 2018-02-03 ENCOUNTER — Telehealth: Payer: Self-pay

## 2018-02-03 NOTE — Telephone Encounter (Signed)
-----   Message from Hollice Espy, MD sent at 02/03/2018  8:29 AM EST ----- CT and CXR look great.  Thank you Liane Comber.  See you in 3 months.  Hollice Espy, MD

## 2018-02-03 NOTE — Telephone Encounter (Signed)
Patient notified

## 2018-02-06 NOTE — Telephone Encounter (Signed)
Notified patient as instructed, patient pleased. patient agrees  

## 2018-05-01 ENCOUNTER — Other Ambulatory Visit: Payer: Self-pay

## 2018-05-01 DIAGNOSIS — C6290 Malignant neoplasm of unspecified testis, unspecified whether descended or undescended: Secondary | ICD-10-CM

## 2018-05-02 ENCOUNTER — Ambulatory Visit (INDEPENDENT_AMBULATORY_CARE_PROVIDER_SITE_OTHER): Payer: 59 | Admitting: Urology

## 2018-05-02 ENCOUNTER — Encounter: Payer: Self-pay | Admitting: Urology

## 2018-05-02 VITALS — BP 124/77 | HR 78 | Ht 70.0 in | Wt 201.7 lb

## 2018-05-02 DIAGNOSIS — C6290 Malignant neoplasm of unspecified testis, unspecified whether descended or undescended: Secondary | ICD-10-CM | POA: Diagnosis not present

## 2018-05-02 DIAGNOSIS — C6212 Malignant neoplasm of descended left testis: Secondary | ICD-10-CM

## 2018-05-02 NOTE — Progress Notes (Signed)
05/02/2018 10:30 AM   Manuel [name] November 27, 1995 [id number]  Referring provider: Birdie Sons, MD [address] [place], [place] [address]  Chief Complaint  Patient presents with  . Follow-up    testicular cancer     HPI: Manuel [name] is a 23 y.o. male Caucasian with a personal history of mixed germ cell testicular cancer, status post left orchiectomy 12/2016.    He has repeatedly no showed, for 3 serial appointments, likely due to recent emotional and psychosocial issues due to his parents divorcing and his father returning to [place] with few visits back to the [place].  The pathology on his tumor is pT1a mixed germ cell tumor (74% embryonal carcinoma 10% yolk, sac, 1% choriocarcinoma, 15% teratoma), negative margins, negative spermatic cord margin, no LVI, 2.7 cm in size.  Preoperative tumor markers were AFP 231.2 and b-HCG 305, and postoperative tumor markers were AFP, b-HCG, and LDH all within normal limits.  Between his previous two visits, he had no issues including no weight loss, changes in his medical status, or any other concerns.  He had been doing regular self testicular exams, which were unremarkable.  Was unchanged from last visit.  Patient denies today (05/02/2018) any problems.  Patient reports good appetite no intentional weight loss.  Patient denies suprapubic, lower back, and flank pain.  Patient denies painful erections, curvature with erections.  Patient has been continuing to perform regular self testicular exams and has not noted any changes.    Patient's mood is good.  PMH: Past Medical History:  Diagnosis Date  . Cancer ([place])    testicular   . Complication of anesthesia   . Headache    migraines    Surgical History: Past Surgical History:  Procedure Laterality Date  . ORCHIECTOMY Left 12/31/2016   Procedure: RADICAL INGUINAL ORCHIECTOMY;  Surgeon: Nickie Retort, MD;  Location: [facility] ORS;  Service: Urology;  Laterality:  Left;  . TONSILLECTOMY AND ADENOIDECTOMY  2009    Home Medications:  Allergies as of 05/02/2018      Reactions   Other Other (See Comments)   Grass pollen      Medication List    as of May 02, 2018 11:59 PM   You have not been prescribed any medications.     Allergies:  Allergies  Allergen Reactions  . Other Other (See Comments)    Grass pollen    Family History: Family History  Problem Relation Age of Onset  . Healthy Mother   . Healthy Father   . Healthy Brother     Social History:  reports that he quit smoking about a year ago. His smoking use included e-cigarettes. He has never used smokeless tobacco. He reports current alcohol use. He reports that he does not use drugs.  ROS: UROLOGY Frequent Urination?: No Hard to postpone urination?: No Burning/pain with urination?: No Get up at night to urinate?: No Leakage of urine?: No Urine stream starts and stops?: No Trouble starting stream?: No Do you have to strain to urinate?: No Blood in urine?: No Urinary tract infection?: No Sexually transmitted disease?: No Injury to kidneys or bladder?: No Painful intercourse?: No Weak stream?: No Erection problems?: No Penile pain?: No  Gastrointestinal Nausea?: No Vomiting?: No Indigestion/heartburn?: No Diarrhea?: No Constipation?: No  Constitutional Fever: No Night sweats?: No Weight loss?: No Fatigue?: No  Skin Skin rash/lesions?: No Itching?: No  Eyes Blurred vision?: No Double vision?: No  Ears/Nose/Throat Sore throat?: No Sinus problems?:  No  Hematologic/Lymphatic Swollen glands?: No Easy bruising?: No  Cardiovascular Leg swelling?: No Chest pain?: No  Respiratory Cough?: No Shortness of breath?: No  Endocrine Excessive thirst?: No  Musculoskeletal Back pain?: No Joint pain?: No  Neurological Headaches?: No Dizziness?: No  Psychologic Depression?: No Anxiety?: No  Physical Exam: BP 124/77   Pulse 78   Ht 5'  10" (1.778 m)   Wt 201 lb 11.2 oz (91.5 kg)   SpO2 98%   BMI 28.94 kg/m   Constitutional:  Well nourished. Alert and oriented, No acute distress. Cardiovascular: No clubbing, cyanosis, or edema. Respiratory: Normal respiratory effort, no increased work of breathing. GU: No CVA tenderness.  No bladder fullness or masses.  Patient with circumcised phallus.  Urethral meatus is patent.  No penile discharge. No penile lesions or rashes. Scrotum without lesions, cysts, rashes and/or edema.  Left testicle is surgically absent.  Right testicle is located scrotally. No masses are appreciated in the right testicle.  Right epididymis is normal. Skin: No rashes, bruises or suspicious lesions. Neurologic: Grossly intact, no focal deficits, moving all 4 extremities. Psychiatric: Normal mood and affect.  Laboratory Data: Lab Results  Component Value Date   WBC 7.8 12/28/2016   HGB 16.5 12/28/2016   HCT 49.3 12/28/2016   MCV 91 12/28/2016   PLT 240 12/28/2016   Lab Results  Component Value Date   CREATININE 0.85 12/28/2016   Lab Results  Component Value Date   AST 20 12/28/2016   Lab Results  Component Value Date   ALT 33 12/28/2016   Urinalysis    Component Value Date/Time   COLORURINE YELLOW (A) 12/26/2016 0502   APPEARANCEUR Clear 12/27/2016 1400   LABSPEC 1.030 12/26/2016 0502   PHURINE 6.0 12/26/2016 0502   GLUCOSEU Negative 12/27/2016 1400   HGBUR NEGATIVE 12/26/2016 0502   BILIRUBINUR Negative 12/27/2016 1400   KETONESUR 20 (A) 12/26/2016 0502   PROTEINUR Negative 12/27/2016 1400   PROTEINUR 30 (A) 12/26/2016 0502   NITRITE Negative 12/27/2016 1400   NITRITE NEGATIVE 12/26/2016 0502   LEUKOCYTESUR Negative 12/27/2016 1400   I have reviewed the labs.  Assessment & Plan:    1. Testicular cancer - Patient is doing well -testicular tumor markers obtained today - will contact patient with his results  - RTC in 3 months for testicular tumor markers, CT, chest xray, and  appointment with Dr. Erlene Quan  Return in about 3 months (around 07/31/2018) for testicular tumor markers, CT, chest xray and appointment with Dr. Erlene Quan .  Zara Council, PA-C  [facility] [address] [place], [place] [address] [phone]  I, Adele Schilder, am acting as a scribe for [facility], PA-C   I have reviewed the above documentation for accuracy and completeness, and I agree with the above.    Zara Council, PA-C

## 2018-05-03 ENCOUNTER — Telehealth: Payer: Self-pay | Admitting: *Deleted

## 2018-05-03 LAB — BETA HCG QUANT (REF LAB): hCG Quant: <1 m[IU]/mL (ref 0–3)

## 2018-05-03 LAB — AFP TUMOR MARKER: AFP, Serum, Tumor Marker: 1.3 ng/mL (ref 0.0–8.3)

## 2018-05-03 LAB — LACTATE DEHYDROGENASE: LDH: 156 IU/L (ref 121–224)

## 2018-05-03 NOTE — Telephone Encounter (Signed)
-----   Message from Hollice Espy, MD sent at 05/03/2018  8:00 AM EST ----- Tumor markers negative.  Hollice Espy, MD

## 2018-05-03 NOTE — Telephone Encounter (Signed)
Notified patient as instructed, patient pleased. Discussed follow-up appointments, patient agrees  

## 2018-05-10 IMAGING — MR MR HEAD WO/W CM
11 series · 47 of 48 positions shown · IV contrast (multihance)
Comparison: CT chest abdomen and pelvis 12/28/2016

CLINICAL DATA: 21-year-old male with mixed histology testicular
cancer. Staging.

EXAM:
MRI HEAD WITHOUT AND WITH CONTRAST
TECHNIQUE: Multiplanar, multiecho pulse sequences of the brain and surrounding
structures were obtained without and with intravenous contrast.
CONTRAST:  19mL MULTIHANCE GADOBENATE DIMEGLUMINE 529 MG/ML IV SOLN

[Series 3: DWI · axial · 3.0mm · 1.20mm/px · z∈[-57,+102]mm · 4 of 55 slices shown (1 of 2)]
[im 1/55]
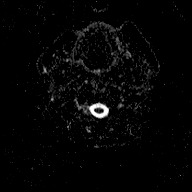
[im 19/55]
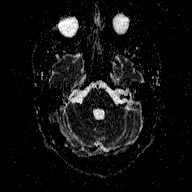
[im 37/55]
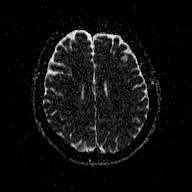
[im 55/55]
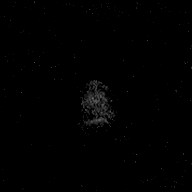

[Series 4: T1 · sagittal · 5.0mm · 0.45mm/px · 2 of 23 slices shown (1 of 2)]
[im 1/23]
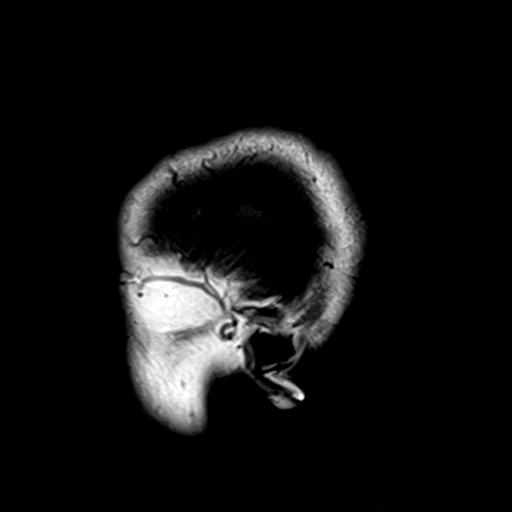
[im 23/23]
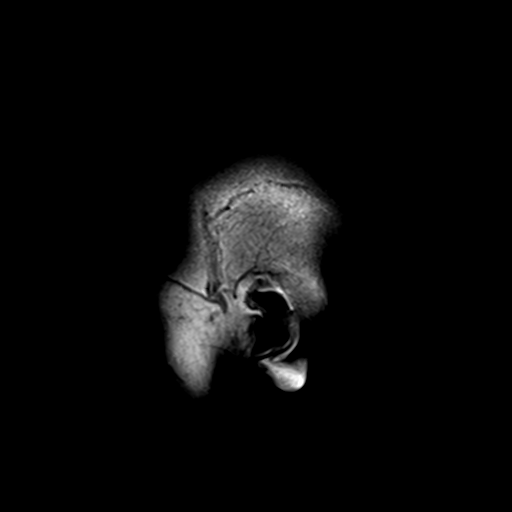

[Series 5: T2 · axial · 5.0mm · 0.72mm/px · z∈[-56,+102]mm · 2 of 24 slices shown (1 of 2)]
[im 1/24]
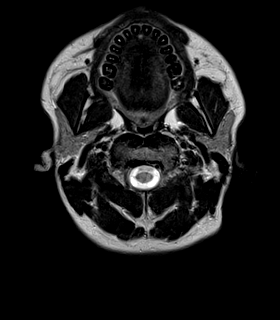
[im 24/24]
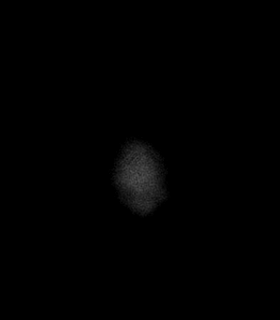

[Series 6: FLAIR · axial · 3.0mm · 0.45mm/px · z∈[-57,+102]mm · 4 of 55 slices shown]
[im 1/55]
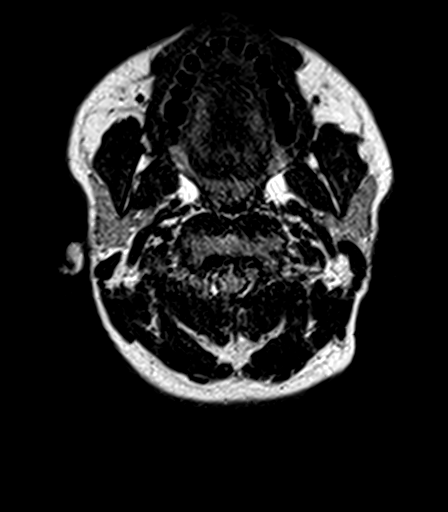
[im 19/55]
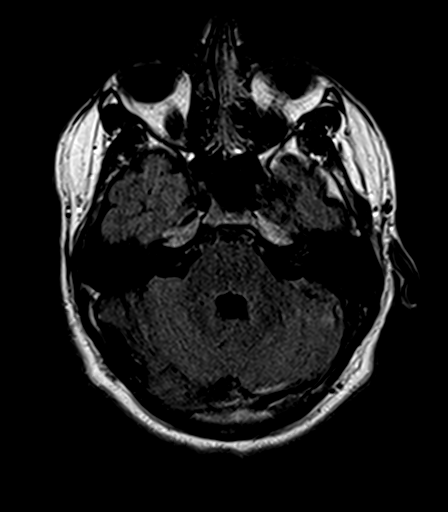
[im 37/55]
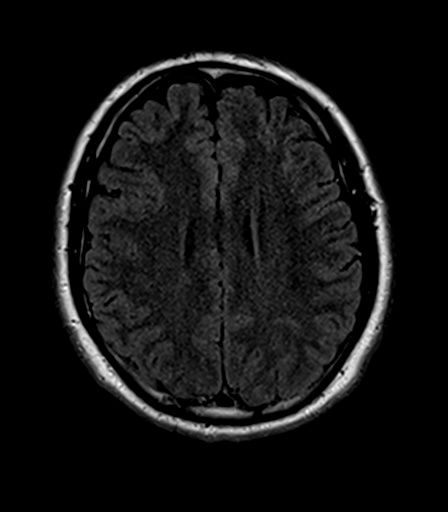
[im 55/55]
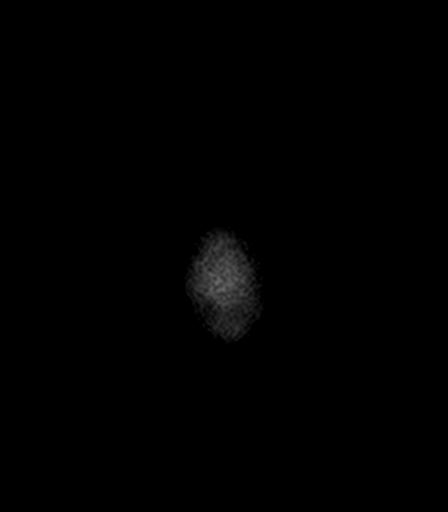

[Series 7: T2 · axial · 5.0mm · 0.72mm/px · z∈[-56,+102]mm · 2 of 24 slices shown (2 of 2)]
[im 1/24]
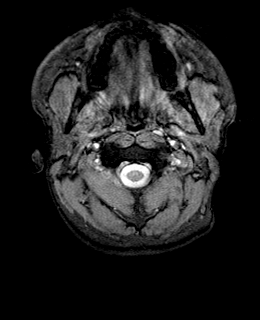
[im 24/24]
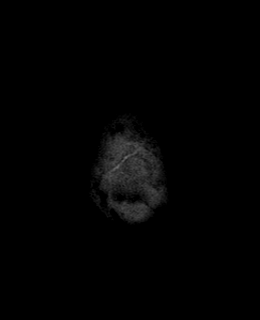

[Series 8: T1 · axial · 1.0mm · 1.00mm/px · z∈[-58,+98]mm · 11 of 160 slices shown (2 of 2)]
[im 1/160]
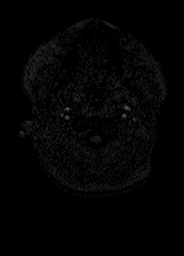
[im 15/160]
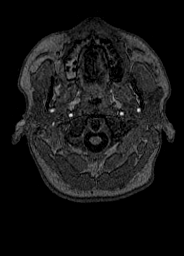
[im 29/160]
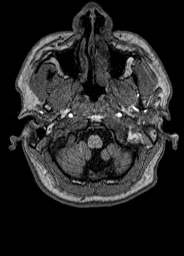
[im 44/160]
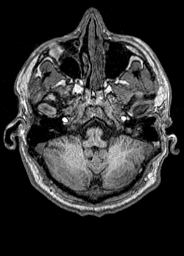
[im 58/160]
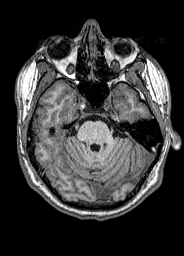
[im 73/160]
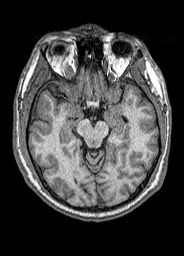
[im 87/160]
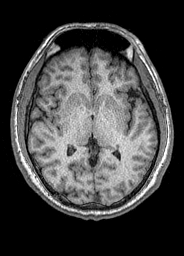
[im 102/160]
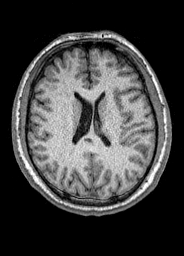
[im 116/160]
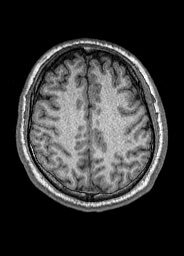
[im 131/160]
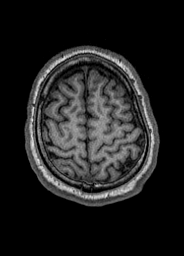
[im 160/160]
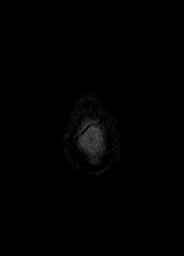

[Series 9: T2 post-contrast · coronal · 5.0mm · 0.45mm/px · 2 of 29 slices shown]
[im 1/29]
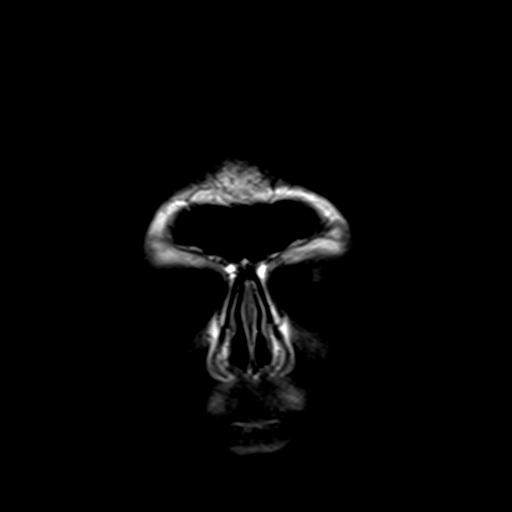
[im 29/29]
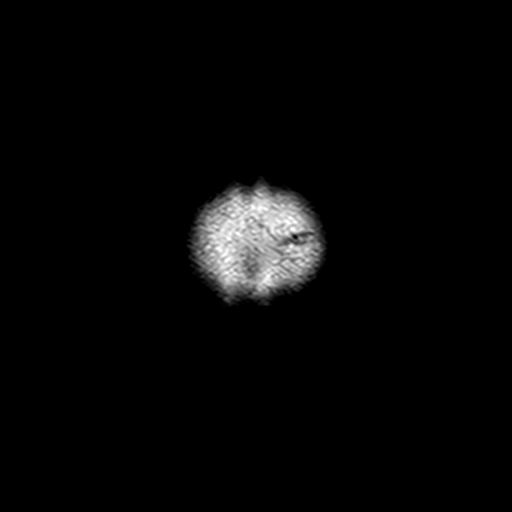

[Series 10: T1 post-contrast · axial · 1.0mm · 1.00mm/px · z∈[-58,+98]mm · 12 of 160 slices shown (1 of 3)]
[im 1/160]
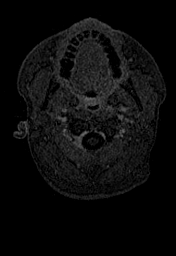
[im 15/160]
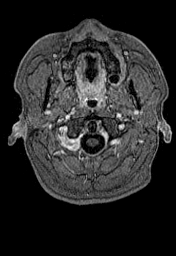
[im 29/160]
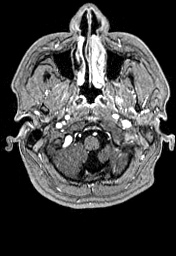
[im 44/160]
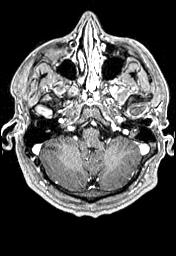
[im 58/160]
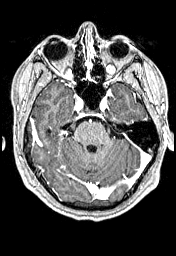
[im 73/160]
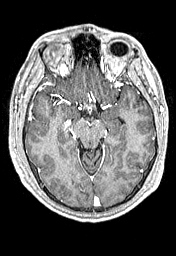
[im 87/160]
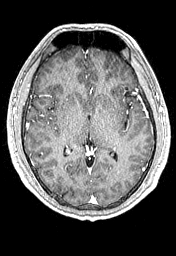
[im 102/160]
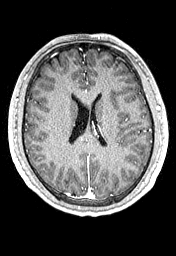
[im 116/160]
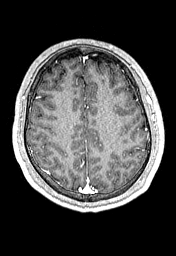
[im 131/160]
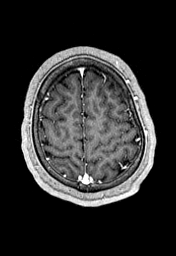
[im 145/160]
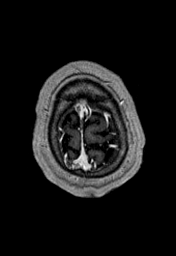
[im 160/160]
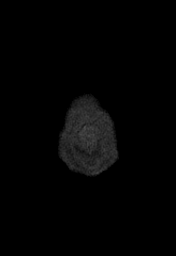

[Series 11: T1 post-contrast · coronal · 5.0mm · 0.45mm/px · 2 of 29 slices shown (2 of 3)]
[im 1/29]
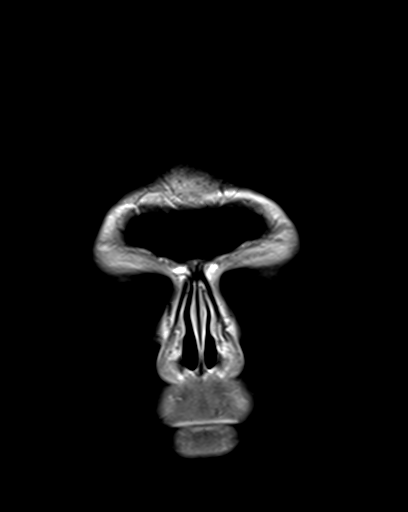
[im 29/29]
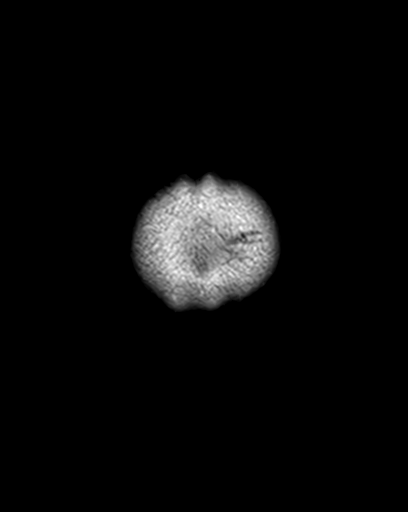

[Series 12: T1 post-contrast · sagittal · 5.0mm · 0.45mm/px · 2 of 23 slices shown (3 of 3)]
[im 1/23]
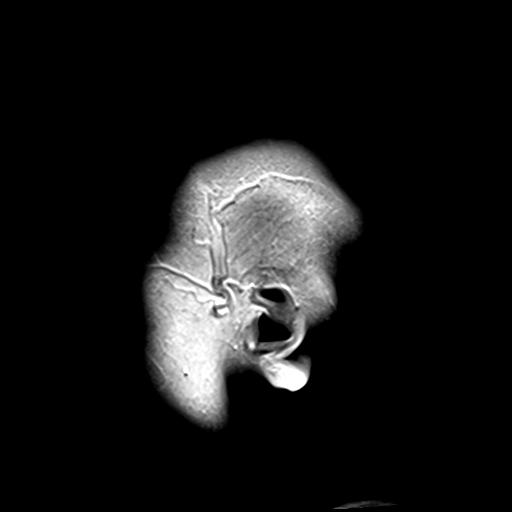
[im 23/23]
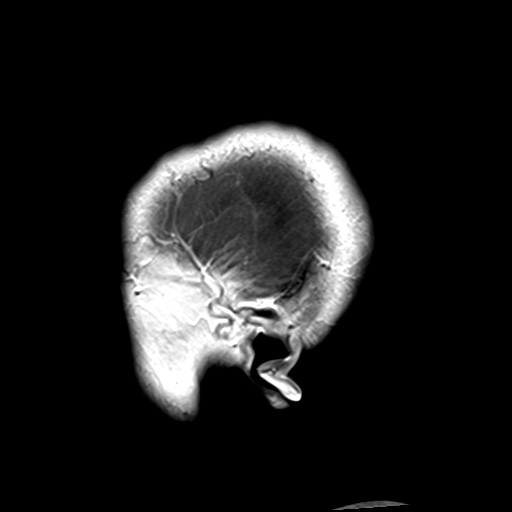

[Series 100: DWI · axial · 3.0mm · 1.20mm/px · z∈[-57,+102]mm · 4 of 55 slices shown (2 of 2)]
[im 1/55]
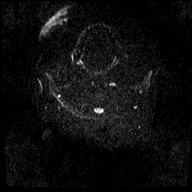
[im 19/55]
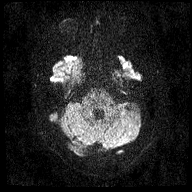
[im 37/55]
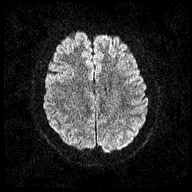
[im 55/55]
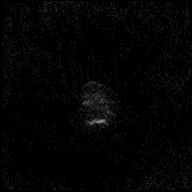

[47 of 48 positions shown; findings below may reference images not displayed]

FINDINGS: Brain: No abnormal enhancement identified. No midline shift, mass
effect, or evidence of intracranial mass lesion.

No definite abnormal dural thickening; smooth linear enhancement
along the ventral surface of the brainstem and inferior cerebellum
on series 12, image 12 is felt to be related to physiologic
vasculature.

Normal cerebral volume. Mild asymmetry of the lateral ventricle size
appears to be a normal variant. No ventriculomegaly. No restricted
diffusion to suggest acute infarction. Noextra-axial collection or
acute intracranial hemorrhage. Cervicomedullary junction within
normal limits. Pituitary size is at the upper limits of normal.

Gray and white matter signal is within normal limits throughout the
brain. No encephalomalacia or chronic cerebral blood products.

Vascular: Major intracranial vascular flow voids are preserved.

Skull and upper cervical spine: Negative visualized cervical spine
and spinal cord. Normal visible bone marrow signal.

Sinuses/Orbits: Orbits soft tissues are mildly degraded by motion
but appear normal. Paranasal sinuses are clear.

Other: Visible internal auditory structures appear normal. Mastoid
air cells are clear. Scalp and face soft tissues appear normal.
IMPRESSION: No metastatic disease or acute intracranial abnormality. Normal MRI
appearance of the brain.

## 2018-07-31 ENCOUNTER — Ambulatory Visit: Admission: RE | Admit: 2018-07-31 | Payer: 59 | Source: Ambulatory Visit

## 2018-08-02 ENCOUNTER — Ambulatory Visit: Payer: 59 | Admitting: Urology

## 2018-08-03 ENCOUNTER — Ambulatory Visit: Payer: 59 | Admitting: Urology

## 2018-09-04 ENCOUNTER — Telehealth: Payer: Self-pay | Admitting: Urology

## 2018-09-04 NOTE — Telephone Encounter (Signed)
We have made several attempts to reach the patient and his mother regarding his upcoming appt and his scans. He has not returned to calls and we have not been able to get him rescheduled for his CT scans. They have now expired and will need another PA. His app is 09-05-18 please advise on what you would like to do? Not sure if he is going to come.   Sharyn Lull

## 2018-09-04 NOTE — Telephone Encounter (Signed)
We will continue to hope that if comes to his next appointment.  If he does not, will send another certified letter.  We continue to have this issue with him.  Hollice Espy, MD

## 2018-09-05 ENCOUNTER — Ambulatory Visit: Payer: 59 | Admitting: Urology

## 2018-09-05 ENCOUNTER — Encounter: Payer: Self-pay | Admitting: Urology

## 2018-09-08 ENCOUNTER — Telehealth: Payer: Self-pay | Admitting: Urology

## 2018-09-08 ENCOUNTER — Encounter: Payer: Self-pay | Admitting: Family Medicine

## 2018-09-08 NOTE — Telephone Encounter (Signed)
Dr. Erlene Quan would like for you to send him a certified letter since he no showed for his app on 09-05-18 and did not get his CT scans. She wants it documented that we did reach out to him.  Thanks, Sharyn Lull

## 2018-09-08 NOTE — Telephone Encounter (Signed)
Letter has been sent

## 2018-10-17 IMAGING — CT CT ABD-PELV W/ CM
2 of 4 series · 16 of 46 positions shown, 18 images · IV contrast (iopamidol)
Comparison: 12/28/2016

CLINICAL DATA: Followup left testicular carcinoma. Previous left
radical orchiectomy.

EXAM:
CT ABDOMEN AND PELVIS WITH CONTRAST
TECHNIQUE: Multidetector CT imaging of the abdomen and pelvis was performed
using the standard protocol following bolus administration of
intravenous contrast.
CONTRAST:  100mL [id number] IOPAMIDOL ([id number]) INJECTION 61%

[Series 2: abd pelvis · axial · 0.76mm/px · z∈[-1671,-1201]mm · 13 of 102 slices shown, 15 images (1 of 2)]
[im 4/102  soft-tissue]
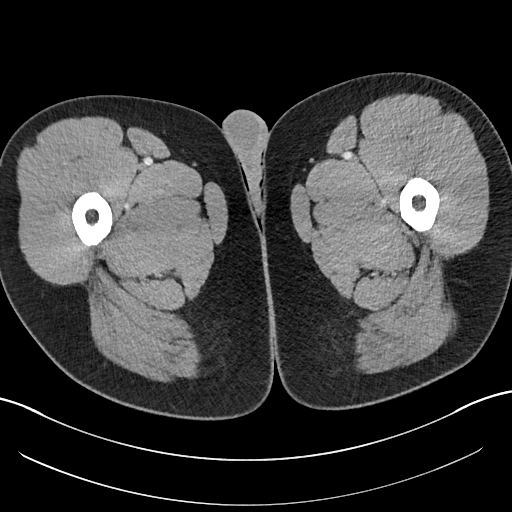
[im 4/102  bone]
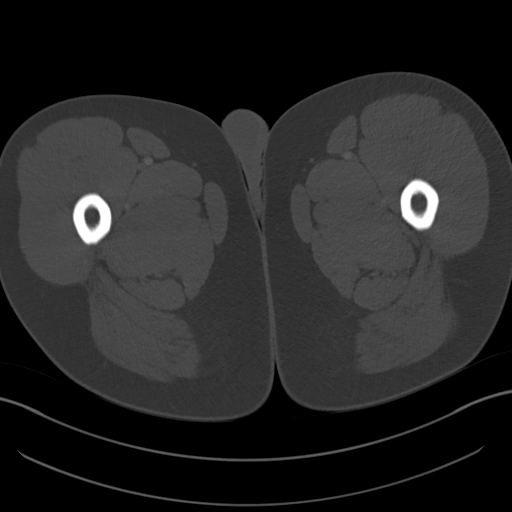
[im 12/102  soft-tissue]
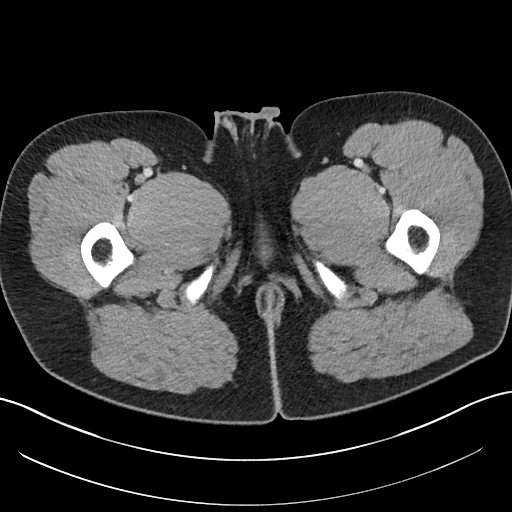
[im 20/102  soft-tissue]
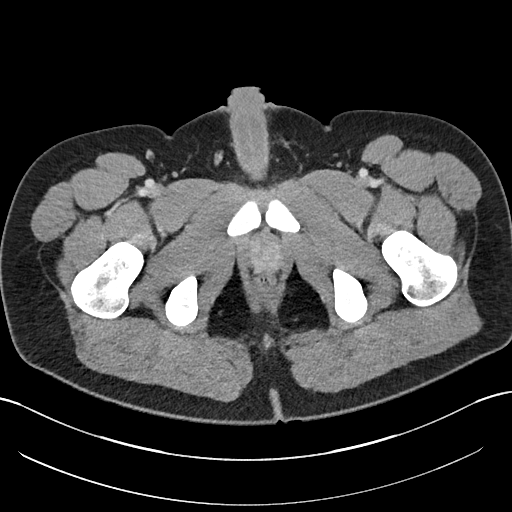
[im 28/102  soft-tissue]
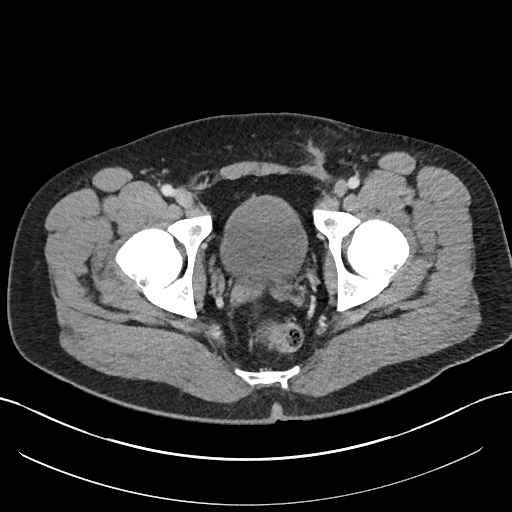
[im 35/102  soft-tissue]
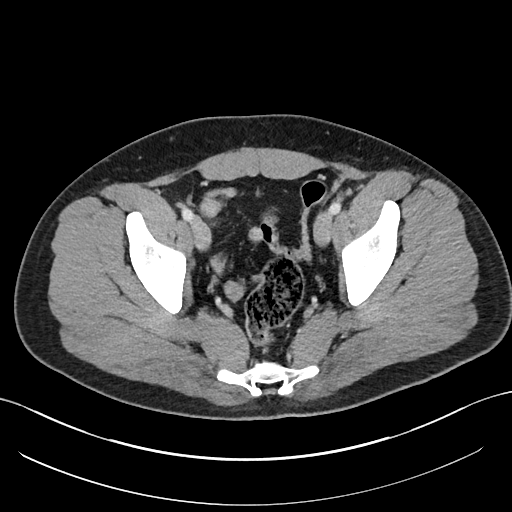
[im 43/102  soft-tissue]
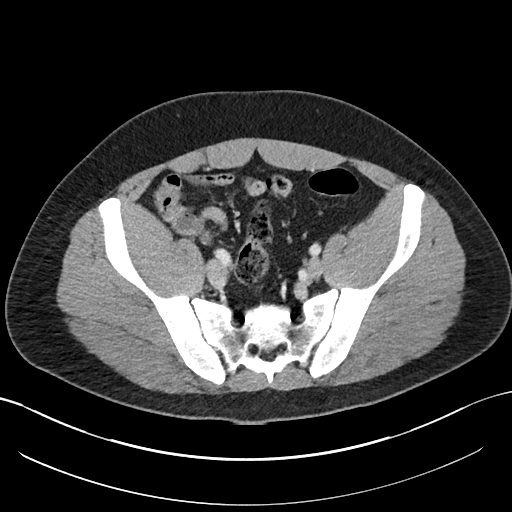
[im 51/102  soft-tissue]
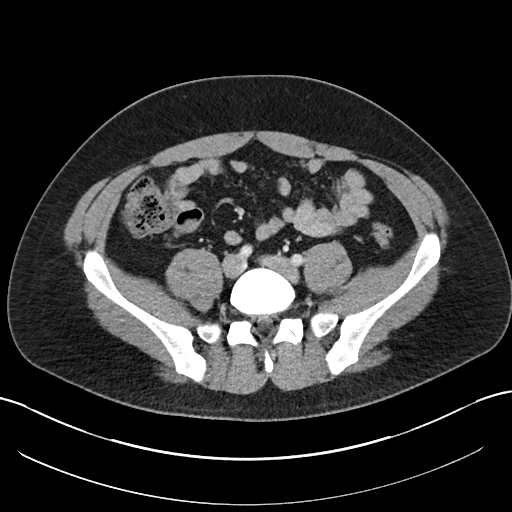
[im 59/102  soft-tissue]
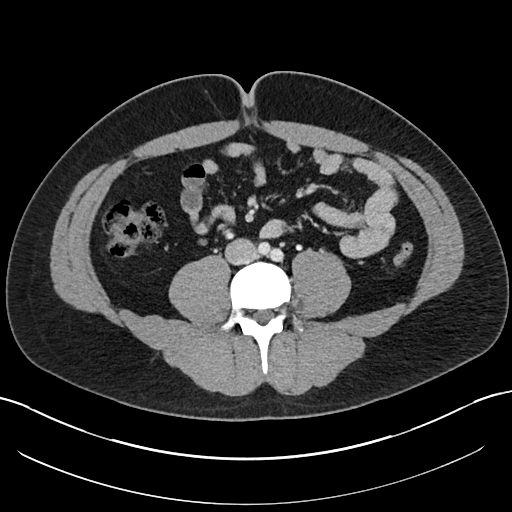
[im 67/102  soft-tissue]
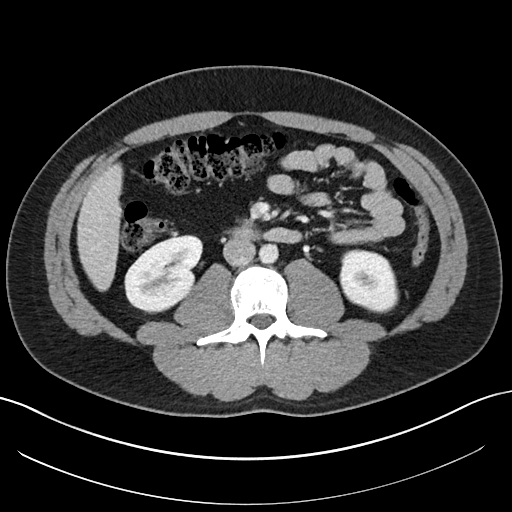
[im 67/102  bone]
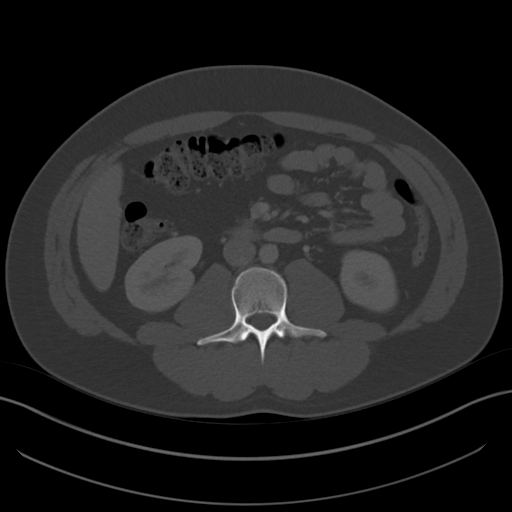
[im 74/102  soft-tissue]
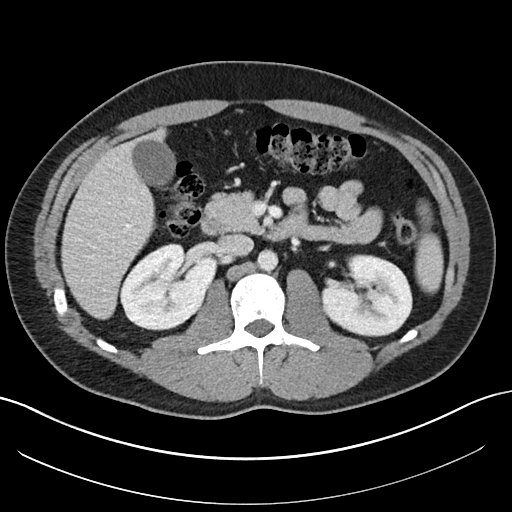
[im 82/102  soft-tissue]
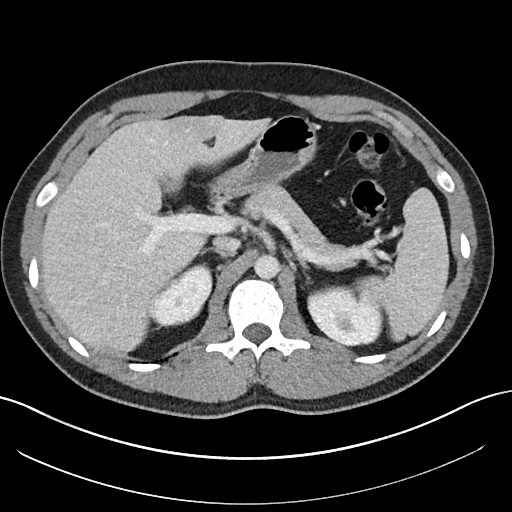
[im 90/102  soft-tissue]
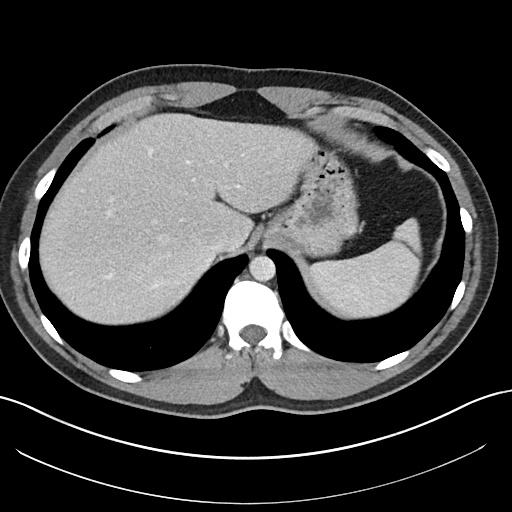
[im 98/102  soft-tissue]
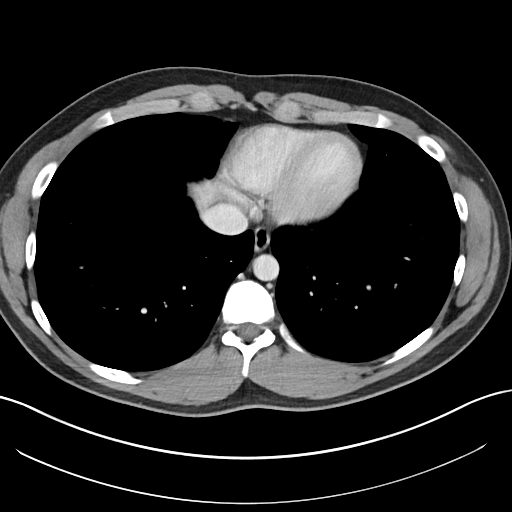

[Series 5: abd pelvis · coronal · 0.76mm/px · 3 of 146 slices shown (2 of 2)]
[im 49/146  soft-tissue]
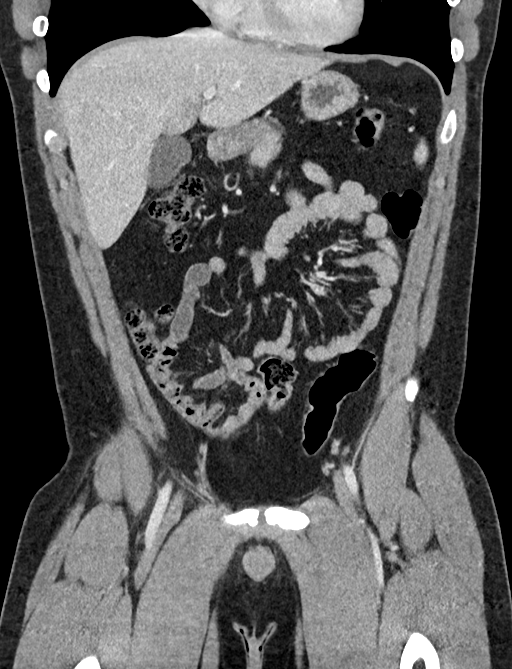
[im 65/146  soft-tissue]
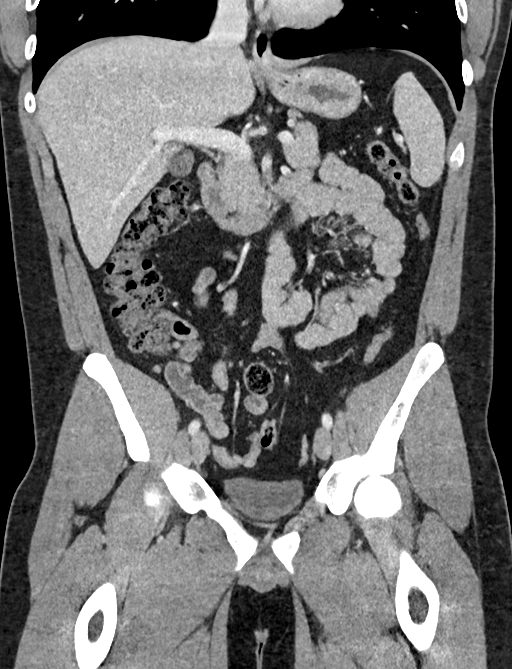
[im 81/146  soft-tissue]
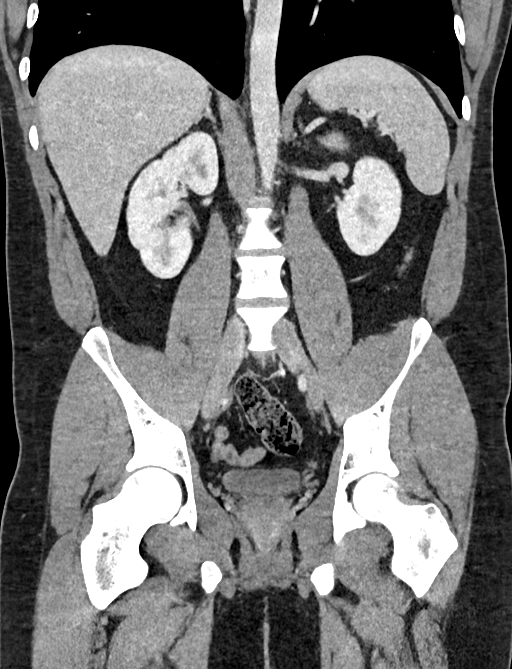

[16 of 46 positions shown; findings below may reference images not displayed]

FINDINGS: Lower Chest: No acute findings.

Hepatobiliary: No hepatic masses identified. Gallbladder is
unremarkable.

Pancreas:  No mass or inflammatory changes.

Spleen: Within normal limits in size and appearance.

Adrenals/Urinary Tract: No masses identified. No evidence of
hydronephrosis.

Stomach/Bowel: No evidence of obstruction, inflammatory process or
abnormal fluid collections.

Vascular/Lymphatic: No pathologically enlarged lymph nodes. No
abdominal aortic aneurysm.

Reproductive: No mass or other significant abnormality. Expected
postop changes from previous left orchiectomy.

Other:  None.

Musculoskeletal:  No suspicious bone lesions identified.
IMPRESSION: Negative. No evidence of abdominal or pelvic metastatic disease, or
other significant abnormality.

## 2019-02-15 ENCOUNTER — Telehealth: Payer: Self-pay | Admitting: Urology

## 2019-02-15 NOTE — Telephone Encounter (Signed)
I spoke yesterday with Manuel [name], Manuel [name], regarding the importance of follow up blood work and imaging studies for Manuel [name].  She states that she and her daughter, Manuel [name], have been trying to persuade him to make an appointment with Dr. Erlene Quan.  He is worried about the cost of the follow up studies and tells them he feels fine.  I expressed to Manuel [name] that we would get him in contact with services that would assist in helping him pay for his medical bills.  I also sent a message to his [name], Manuel [name], regarding this as he has an upcoming appointment with him and she too will encourage him to follow up with [place].

## 2019-02-23 ENCOUNTER — Other Ambulatory Visit: Payer: Self-pay

## 2019-02-23 ENCOUNTER — Encounter: Payer: Self-pay | Admitting: Physician Assistant

## 2019-02-23 ENCOUNTER — Ambulatory Visit (INDEPENDENT_AMBULATORY_CARE_PROVIDER_SITE_OTHER): Payer: 59 | Admitting: Physician Assistant

## 2019-02-23 VITALS — BP 139/82 | HR 79 | Temp 97.3°F | Resp 16 | Ht >= 80 in | Wt 212.0 lb

## 2019-02-23 DIAGNOSIS — F4323 Adjustment disorder with mixed anxiety and depressed mood: Secondary | ICD-10-CM | POA: Diagnosis not present

## 2019-02-23 MED ORDER — CITALOPRAM HYDROBROMIDE 20 MG PO TABS: 20.0000 mg | ORAL_TABLET | Freq: Every day | ORAL | 0 refills | Status: AC
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
## 2019-02-23 NOTE — Patient Instructions (Signed)
10 Relaxation Techniques That Zap Stress Fast By Jeannette Moninger   Listen  Relax. You deserve it, it's good for you, and it takes less time than you think. You don't need a spa weekend or a retreat. Each of these stress-relieving tips can get you from OMG to om in less than 15 minutes. 1. Meditate  A few minutes of practice per day can help ease anxiety. "Research suggests that daily meditation may alter the brain's neural pathways, making you more resilient to stress," says psychologist Robbie Maller Hartman, PhD, a [place] health and wellness coach. It's simple. Sit up straight with both feet on the floor. Close your eyes. Focus your attention on reciting -- out loud or silently -- a positive mantra such as "I feel at peace" or "I love myself." Place one hand on your belly to sync the mantra with your breaths. Let any distracting thoughts float by like clouds. 2. Breathe Deeply  Take a 5-minute break and focus on your breathing. Sit up straight, eyes closed, with a hand on your belly. Slowly inhale through your nose, feeling the breath start in your abdomen and work its way to the top of your head. Reverse the process as you exhale through your mouth.  "Deep breathing counters the effects of stress by slowing the heart rate and lowering blood pressure," psychologist Judith Tutin, PhD, says. She's a certified life coach in [place], [place] 3. Be Present  Slow down.  "Take 5 minutes and focus on only one behavior with awareness," Tutin says. Notice how the air feels on your face when you're walking and how your feet feel hitting the ground. Enjoy the texture and taste of each bite of food. When you spend time in the moment and focus on your senses, you should feel less tense. 4. Reach Out  Your social network is one of your best tools for handling stress. Talk to others -- preferably face to face, or at least on the phone. Share what's going on. You can get a fresh perspective while keeping your  connection strong. 5. Tune In to Your Body  Mentally scan your body to get a sense of how stress affects it each day. Lie on your back, or sit with your feet on the floor. Start at your toes and work your way up to your scalp, noticing how your body feels.  "Simply be aware of places you feel tight or loose without trying to change anything," Tutin says. For 1 to 2 minutes, imagine each deep breath flowing to that body part. Repeat this process as you move your focus up your body, paying close attention to sensations you feel in each body part. 6. Decompress  Place a warm heat wrap around your neck and shoulders for 10 minutes. Close your eyes and relax your face, neck, upper chest, and back muscles. Remove the wrap, and use a tennis ball or foam roller to massage away tension.  "Place the ball between your back and the wall. Lean into the ball, and hold gentle pressure for up to 15 seconds. Then move the ball to another spot, and apply pressure," says Cathy Benninger, a nurse practitioner and assistant professor at [facility] in [place]. 7. Laugh Out Loud  A good belly laugh doesn't just lighten the load mentally. It lowers cortisol, your body's stress hormone, and boosts brain chemicals called endorphins, which help your mood. Lighten up by tuning in to your favorite sitcom or video, reading   the comics, or chatting with someone who makes you smile. 8. Crank Up the [place] shows that listening to soothing music can lower blood pressure, heart rate, and anxiety. "Create a playlist of songs or nature sounds (the ocean, a bubbling brook, birds chirping), and allow your mind to focus on the different melodies, instruments, or singers in the piece," Benninger says. You also can blow off steam by rocking out to more upbeat tunes -- or singing at the top of your lungs! 9. Get Moving  You don't have to run in order to get a runner's high. All forms of exercise,  including yoga and walking, can ease depression and anxiety by helping the brain release feel-good chemicals and by giving your body a chance to practice dealing with stress. You can go for a quick walk around the block, take the stairs up and down a few flights, or do some stretching exercises like head rolls and shoulder shrugs. 10. Be Grateful  Keep a gratitude journal or several (one by your bed, one in your purse, and one at work) to help you remember all the things that are good in your life.  "Being grateful for your blessings cancels out negative thoughts and worries," says Joni Emmerling, a wellness coach in [place], [place].  Use these journals to savor good experiences like a child's smile, a sunshine-filled day, and good health. Don't forget to celebrate accomplishments like mastering a new task at work or a new hobby. When you start feeling stressed, spend a few minutes looking through your notes to remind yourself what really matters.   Citalopram tablets What is this medicine? CITALOPRAM (sye TAL oh pram) is a medicine for depression. This medicine may be used for other purposes; ask your health care provider or pharmacist if you have questions. COMMON BRAND NAME(S): Celexa What should I tell my health care provider before I take this medicine? They need to know if you have any of these conditions:  bleeding disorders  bipolar disorder or a family history of bipolar disorder  glaucoma  heart disease  history of irregular heartbeat  kidney disease  liver disease  low levels of magnesium or potassium in the blood  receiving electroconvulsive therapy  seizures  suicidal thoughts, plans, or attempt; a previous suicide attempt by you or a family member  take medicines that treat or prevent blood clots  thyroid disease  an unusual or allergic reaction to citalopram, escitalopram, other medicines, foods, dyes, or preservatives  pregnant or trying to become  pregnant  breast-feeding How should I use this medicine? Take this medicine by mouth with a glass of water. Follow the directions on the prescription label. You can take it with or without food. Take your medicine at regular intervals. Do not take your medicine more often than directed. Do not stop taking this medicine suddenly except upon the advice of your doctor. Stopping this medicine too quickly may cause serious side effects or your condition may worsen. A special MedGuide will be given to you by the pharmacist with each prescription and refill. Be sure to read this information carefully each time. Talk to your pediatrician regarding the use of this medicine in children. Special care may be needed. Patients over 62 years old may have a stronger reaction and need a smaller dose. Overdosage: If you think you have taken too much of this medicine contact a poison control center or emergency room at once. NOTE: This medicine is only for you. Do not  share this medicine with others. What if I miss a dose? If you miss a dose, take it as soon as you can. If it is almost time for your next dose, take only that dose. Do not take double or extra doses. What may interact with this medicine? Do not take this medicine with any of the following medications:  certain medicines for fungal infections like fluconazole, itraconazole, ketoconazole, posaconazole, voriconazole  cisapride  dronedarone  escitalopram  linezolid  MAOIs like Carbex, Eldepryl, Marplan, Nardil, and Parnate  methylene blue (injected into a vein)  pimozide  thioridazine This medicine may also interact with the following medications:  alcohol  amphetamines  aspirin and aspirin-like medicines  carbamazepine  certain medicines for depression, anxiety, or psychotic disturbances  certain medicines for infections like chloroquine, clarithromycin, erythromycin, furazolidone, isoniazid, pentamidine  certain medicines for  migraine headaches like almotriptan, eletriptan, frovatriptan, naratriptan, rizatriptan, sumatriptan, zolmitriptan  certain medicines for sleep  certain medicines that treat or prevent blood clots like dalteparin, enoxaparin, warfarin  cimetidine  diuretics  dofetilide  fentanyl  lithium  methadone  metoprolol  NSAIDs, medicines for pain and inflammation, like ibuprofen or naproxen  omeprazole  other medicines that prolong the QT interval (cause an abnormal heart rhythm)  procarbazine  rasagiline  supplements like St. John's wort, kava kava, valerian  tramadol  tryptophan  ziprasidone This list may not describe all possible interactions. Give your health care provider a list of all the medicines, herbs, non-prescription drugs, or dietary supplements you use. Also tell them if you smoke, drink alcohol, or use illegal drugs. Some items may interact with your medicine. What should I watch for while using this medicine? Tell your doctor if your symptoms do not get better or if they get worse. Visit your doctor or health care professional for regular checks on your progress. Because it may take several weeks to see the full effects of this medicine, it is important to continue your treatment as prescribed by your doctor. Patients and their families should watch out for new or worsening thoughts of suicide or depression. Also watch out for sudden changes in feelings such as feeling anxious, agitated, panicky, irritable, hostile, aggressive, impulsive, severely restless, overly excited and hyperactive, or not being able to sleep. If this happens, especially at the beginning of treatment or after a change in dose, call your health care professional. Dennis Bast may get drowsy or dizzy. Do not drive, use machinery, or do anything that needs mental alertness until you know how this medicine affects you. Do not stand or sit up quickly, especially if you are an older patient. This reduces the  risk of dizzy or fainting spells. Alcohol may interfere with the effect of this medicine. Avoid alcoholic drinks. Your mouth may get dry. Chewing sugarless gum or sucking hard candy, and drinking plenty of water will help. Contact your doctor if the problem does not go away or is severe. What side effects may I notice from receiving this medicine? Side effects that you should report to your doctor or health care professional as soon as possible:  allergic reactions like skin rash, itching or hives, swelling of the face, lips, or tongue  anxious  black, tarry stools  breathing problems  changes in vision  chest pain  confusion  elevated mood, decreased need for sleep, racing thoughts, impulsive behavior  eye pain  fast, irregular heartbeat  feeling faint or lightheaded, falls  feeling agitated, angry, or irritable  hallucination, loss of contact with reality  loss of balance or coordination  loss of memory  painful or prolonged erections  restlessness, pacing, inability to keep still  seizures  stiff muscles  suicidal thoughts or other mood changes  trouble sleeping  unusual bleeding or bruising  unusually weak or tired  vomiting Side effects that usually do not require medical attention (report to your doctor or health care professional if they continue or are bothersome):  change in appetite or weight  change in sex drive or performance  dizziness  headache  increased sweating  indigestion, nausea  tremors This list may not describe all possible side effects. Call your doctor for medical advice about side effects. You may report side effects to FDA at 1-800-FDA-1088. Where should I keep my medicine? Keep out of reach of children. Store at room temperature between 15 and 30 degrees C (59 and 86 degrees F). Throw away any unused medicine after the expiration date. NOTE: This sheet is a summary. It may not cover all possible information. If you  have questions about this medicine, talk to your doctor, pharmacist, or health care provider.  2020 Elsevier/Gold Standard (2018-02-20 09:05:36)

## 2019-03-23 ENCOUNTER — Ambulatory Visit: Payer: Self-pay | Admitting: Physician Assistant

## 2019-06-15 ENCOUNTER — Ambulatory Visit: Payer: Self-pay | Attending: Internal Medicine

## 2019-06-15 ENCOUNTER — Other Ambulatory Visit: Payer: Self-pay

## 2019-06-15 DIAGNOSIS — Z23 Encounter for immunization: Secondary | ICD-10-CM

## 2019-06-15 NOTE — Progress Notes (Signed)
   Covid-19 Vaccination Clinic  Name:  Manuel [name]    MRN: [id number] DOB: [birth year]  06/15/2019  Manuel [name] was observed post Covid-19 immunization for 15 minutes without incident. He was provided with Vaccine Information Sheet and instruction to access the V-Safe system.   Manuel [name] was instructed to call 911 with any severe reactions post vaccine: Marland Kitchen Difficulty breathing  . Swelling of face and throat  . A fast heartbeat  . A bad rash all over body  . Dizziness and weakness   Immunizations Administered    Name Date Dose VIS Date Route   Pfizer COVID-19 Vaccine 06/15/2019  8:15 AM 0.3 mL 02/23/2019 Intramuscular   Manufacturer: [place]   Lot: [id number]   [place]: [id number]

## 2019-07-10 ENCOUNTER — Ambulatory Visit: Payer: Self-pay | Attending: Internal Medicine

## 2019-07-10 DIAGNOSIS — Z23 Encounter for immunization: Secondary | ICD-10-CM

## 2019-07-10 NOTE — Progress Notes (Signed)
   Covid-19 Vaccination Clinic  Name:  Manuel [name]    MRN: [id number] DOB: 1995/06/05  07/10/2019  Manuel [name] was observed post Covid-19 immunization for 15 minutes without incident. He was provided with Vaccine Information Sheet and instruction to access the V-Safe system.   Manuel [name] was instructed to call 911 with any severe reactions post vaccine: Marland Kitchen Difficulty breathing  . Swelling of face and throat  . A fast heartbeat  . A bad rash all over body  . Dizziness and weakness   Immunizations Administered    Name Date Dose VIS Date Route   Pfizer COVID-19 Vaccine 07/10/2019 10:37 AM 0.3 mL 05/09/2018 Intramuscular   Manufacturer: [employer]   Lot: [id number]   [place]: [id number]

## 2019-09-02 ENCOUNTER — Emergency Department: Payer: Self-pay

## 2019-09-02 ENCOUNTER — Encounter: Payer: Self-pay | Admitting: Emergency Medicine

## 2019-09-02 ENCOUNTER — Other Ambulatory Visit: Payer: Self-pay

## 2019-09-02 ENCOUNTER — Emergency Department
Admission: EM | Admit: 2019-09-02 | Discharge: 2019-09-02 | Disposition: A | Discharge status: Home / Self Care | Payer: Self-pay | Attending: Emergency Medicine | Admitting: Emergency Medicine

## 2019-09-02 DIAGNOSIS — J029 Acute pharyngitis, unspecified: Secondary | ICD-10-CM | POA: Insufficient documentation

## 2019-09-02 DIAGNOSIS — Z87891 Personal history of nicotine dependence: Secondary | ICD-10-CM | POA: Insufficient documentation

## 2019-09-02 DIAGNOSIS — R61 Generalized hyperhidrosis: Secondary | ICD-10-CM | POA: Insufficient documentation

## 2019-09-02 DIAGNOSIS — Z79899 Other long term (current) drug therapy: Secondary | ICD-10-CM | POA: Insufficient documentation

## 2019-09-02 DIAGNOSIS — H938X3 Other specified disorders of ear, bilateral: Secondary | ICD-10-CM

## 2019-09-02 DIAGNOSIS — E860 Dehydration: Secondary | ICD-10-CM | POA: Insufficient documentation

## 2019-09-02 LAB — COMPREHENSIVE METABOLIC PANEL
ALT: 37 U/L (ref 0–44)
AST: 19 U/L (ref 15–41)
Albumin: 5.1 g/dL — ABNORMAL HIGH (ref 3.5–5.0)
Alkaline Phosphatase: 72 U/L (ref 38–126)
Anion gap: 13 (ref 5–15)
BUN: 32 mg/dL — ABNORMAL HIGH (ref 6–20)
CO2: 23 mmol/L (ref 22–32)
Calcium: 9.4 mg/dL (ref 8.9–10.3)
Chloride: 97 mmol/L — ABNORMAL LOW (ref 98–111)
Creatinine, Ser: 1.25 mg/dL — ABNORMAL HIGH (ref 0.61–1.24)
GFR calc Af Amer: >60 mL/min (ref >60)
GFR calc non Af Amer: >60 mL/min (ref >60)
Glucose, Bld: 102 mg/dL — ABNORMAL HIGH (ref 70–99)
Potassium: 3.5 mmol/L (ref 3.5–5.1)
Sodium: 133 mmol/L — ABNORMAL LOW (ref 135–145)
Total Bilirubin: 1.1 mg/dL (ref 0.3–1.2)
Total Protein: 8.3 g/dL — ABNORMAL HIGH (ref 6.5–8.1)

## 2019-09-02 LAB — CBC
HCT: 48.9 % (ref 39.0–52.0)
Hemoglobin: 17.4 g/dL — ABNORMAL HIGH (ref 13.0–17.0)
MCH: 30.7 pg (ref 26.0–34.0)
MCHC: 35.6 g/dL (ref 30.0–36.0)
MCV: 86.4 fL (ref 80.0–100.0)
Platelets: 299 10*3/uL (ref 150–400)
RBC: 5.66 MIL/uL (ref 4.22–5.81)
RDW: 12.4 % (ref 11.5–15.5)
WBC: 8.9 10*3/uL (ref 4.0–10.5)
nRBC: 0 % (ref 0.0–0.2)

## 2019-09-02 LAB — TSH: TSH: 1.236 u[IU]/mL (ref 0.350–4.500)

## 2019-09-02 NOTE — ED Notes (Signed)
Pt verbalized understanding of discharge instructions. NAD at this time. 

## 2019-09-02 NOTE — Discharge Instructions (Signed)
Please start Claritin-D daily for ear pressure and congestion.  Make sure you are drinking lots of fluids.  Follow-up with PCP in 1 week for recheck.  Return to the ER for any worsening symptoms or new changes in health.

## 2019-09-02 NOTE — ED Notes (Signed)
See triage note, pt reports congestion and fluid in ears that started approx 2 weeks ago. Reports excessively sweating at work (works in [employer]), reports dizziness, headaches SHOB. States he can "hear myself breathing and talking through my ears" Pt in NAD at this time

## 2019-09-02 NOTE — ED Provider Notes (Signed)
[facility] EMERGENCY DEPARTMENT Provider Note   CSN: [id number] Arrival date & time: 09/02/19  1458     History Chief Complaint  Patient presents with  . Otalgia    Manuel [name] is a 24 y.o. male presents to the emergency department for evaluation of multiple medical complaints. Mostly here for pressure behind both the ears times several weeks. Has had a mild sore throat as well as complaints of excessive sweating. He denies any fevers chills. Has had some intermittent nausea. No rashes. No vomiting or diarrhea. 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 He denies any coughing, weakness. Has had a intermittent slight headache. No weight loss.  HPI     Past Medical History:  Diagnosis Date  . 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 Complication of anesthesia   . Headache    migraines    Patient Active Problem List   Diagnosis Date Noted  . Mass of left testicle 12/27/2016    Past Surgical History:  Procedure Laterality Date  . ORCHIECTOMY Left 12/31/2016   Procedure: RADICAL INGUINAL ORCHIECTOMY;  Surgeon: Nickie Retort, MD;  Location: [facility] ORS;  Service: Urology;  Laterality: Left;  . TONSILLECTOMY AND ADENOIDECTOMY  2009       Family History  Problem Relation Age of Onset  . Healthy Mother   . Healthy Father   . Healthy Brother     Social History   Tobacco Use  . Smoking status: Former Smoker    Types: E-cigarettes    Quit date: 05/16/2017    Years since quitting: 2.2  . Smokeless tobacco: Never Used  . Tobacco comment: vapes daily  Vaping Use  . Vaping Use: Every day  Substance Use Topics  . Alcohol use: Yes    Alcohol/week: 0.0 standard drinks    Comment: occ @ parties  . Drug use: No    Home Medications Prior to Admission medications   Medication Sig Start Date End Date Taking? Authorizing Provider  citalopram (CELEXA) 20 MG tablet Take 1 tablet (20 mg total) by mouth daily. May  increase to 40mg  (2 tabs) after 2 weeks if needed 02/23/19   Mar Daring, PA-C    Allergies    Other  Review of Systems   Review of Systems  Constitutional: Negative for chills, fatigue and fever.  HENT: Negative for ear discharge, sinus pressure, sinus pain and trouble swallowing.   Eyes: Negative for visual disturbance.  Respiratory: Negative for cough, choking and shortness of breath.   Cardiovascular: Negative for chest pain and leg swelling.  Gastrointestinal: Positive for nausea. Negative for abdominal pain and vomiting.  Endocrine: Negative for polydipsia and polyuria.  Genitourinary: Negative for decreased urine volume and dysuria.  Musculoskeletal: Negative for back pain.  Neurological: Positive for headaches (Intermittent). Negative for dizziness and numbness.    Physical Exam Updated Vital Signs BP 97/71 (BP Location: Left Arm)   Pulse 87   Temp 98.3 F (36.8 C) (Oral)   Resp 18   Ht 5\' 11"  (1.803 m)   Wt 93 kg   SpO2 98%   BMI 28.59 kg/m   Physical Exam Constitutional:      Appearance: He is well-developed.  HENT:     Head: Normocephalic and atraumatic.     Right Ear: Ear canal and external ear normal. Decreased hearing noted. No drainage. A middle ear effusion is present. There is no impacted cerumen. Tympanic membrane is not  erythematous.     Left Ear: Ear canal and external ear normal. Decreased hearing noted. No drainage. A middle ear effusion is present. There is no impacted cerumen. Tympanic membrane is not erythematous.     Nose: Nose normal. No congestion.     Mouth/Throat:     Mouth: Mucous membranes are moist.     Pharynx: No oropharyngeal exudate or posterior oropharyngeal erythema.  Eyes:     Extraocular Movements: Extraocular movements intact.     Conjunctiva/sclera: Conjunctivae normal.     Pupils: Pupils are equal, round, and reactive to light.  Cardiovascular:     Rate and Rhythm: Normal rate.  Pulmonary:     Effort: Pulmonary  effort is normal. No respiratory distress.  Abdominal:     General: There is no distension.     Tenderness: There is no abdominal tenderness. There is no guarding.  Musculoskeletal:        General: Normal range of motion.     Cervical back: Normal range of motion. No rigidity.  Lymphadenopathy:     Cervical: No cervical adenopathy.  Skin:    General: Skin is warm.     Findings: No rash.  Neurological:     General: No focal deficit present.     Mental Status: He is alert and oriented to person, place, and time. Mental status is at baseline.     Cranial Nerves: No cranial nerve deficit.  Psychiatric:        Mood and Affect: Mood normal.        Behavior: Behavior normal.        Thought Content: Thought content normal.     ED Results / Procedures / Treatments   Labs (all labs ordered are listed, but only abnormal results are displayed) Labs Reviewed  CBC - Abnormal; Notable for the following components:      Result Value   Hemoglobin 17.4 (*)    All other components within normal limits  COMPREHENSIVE METABOLIC PANEL - Abnormal; Notable for the following components:   Sodium 133 (*)    Chloride 97 (*)    Glucose, Bld 102 (*)    BUN 32 (*)    Creatinine, Ser 1.25 (*)    Total Protein 8.3 (*)    Albumin 5.1 (*)    All other components within normal limits  TSH    EKG None  Radiology DG Chest 2 View  Result Date: 09/02/2019 CLINICAL DATA:  24 year old male with sore throat and ear pain. EXAM: CHEST - 2 VIEW COMPARISON:  Chest radiograph dated 02/02/2018. FINDINGS: The heart size and mediastinal contours are within normal limits. Both lungs are clear. The visualized skeletal structures are unremarkable. IMPRESSION: No active cardiopulmonary disease. Electronically Signed   By: Anner Crete M.D.   On: 09/02/2019 16:53    Procedures Procedures (including critical care time)  Medications Ordered in ED Medications - No data to display  ED Course  I have reviewed the  triage vital signs and the nursing notes.  Pertinent labs & imaging results that were available during my care of the patient were reviewed by me and considered in my medical decision making (see chart for details).    MDM Rules/Calculators/A&P                          24 year old male with bilateral ear congestion and dehydration.  CBC, CMP, TSH and chest x-ray negative.  Vital signs are stable.  Patient appears  well.  Labs did indicate dehydration which is consistent with his history.  He will take a couple days off from work, increase his fluid intake.  He will start Claritin-D over-the-counter for his ear congestion.  He understands signs and symptoms return to the ED for.  He will follow-up with PCP in 1 week if no improvement. Final Clinical Impression(s) / ED Diagnoses Final diagnoses:  Dehydration  Congestion of both ears    Rx / DC Orders ED Discharge Orders    None       Renata Caprice 09/02/19 1717    Duffy Bruce, MD 09/03/19 [phone]

## 2019-09-02 NOTE — ED Triage Notes (Signed)
Pt reports works in [employer]. Pt states has been having episodes of fatigue after working. Pt also states has episodes where he can hear himself talking and breathing. Pt A&O x4, ambulatory without difficulty at this time.   Pt also c/o sore throat at this time.

## 2020-06-20 DIAGNOSIS — A044 Other intestinal Escherichia coli infections: Secondary | ICD-10-CM | POA: Insufficient documentation

## 2020-06-20 DIAGNOSIS — Z87891 Personal history of nicotine dependence: Secondary | ICD-10-CM | POA: Insufficient documentation

## 2020-06-20 DIAGNOSIS — A09 Infectious gastroenteritis and colitis, unspecified: Secondary | ICD-10-CM | POA: Insufficient documentation

## 2020-06-20 DIAGNOSIS — A0811 Acute gastroenteropathy due to Norwalk agent: Secondary | ICD-10-CM | POA: Insufficient documentation

## 2020-06-20 DIAGNOSIS — Z8547 Personal history of malignant neoplasm of testis: Secondary | ICD-10-CM | POA: Insufficient documentation

## 2020-06-21 ENCOUNTER — Emergency Department
Admission: EM | Admit: 2020-06-21 | Discharge: 2020-06-21 | Disposition: A | Discharge status: Home / Self Care | Payer: Self-pay | Attending: Emergency Medicine | Admitting: Emergency Medicine

## 2020-06-21 ENCOUNTER — Other Ambulatory Visit: Payer: Self-pay

## 2020-06-21 DIAGNOSIS — A044 Other intestinal Escherichia coli infections: Secondary | ICD-10-CM

## 2020-06-21 DIAGNOSIS — R197 Diarrhea, unspecified: Secondary | ICD-10-CM

## 2020-06-21 DIAGNOSIS — A0811 Acute gastroenteropathy due to Norwalk agent: Secondary | ICD-10-CM

## 2020-06-21 LAB — CBC
HCT: 50.3 % (ref 39.0–52.0)
Hemoglobin: 17.1 g/dL — ABNORMAL HIGH (ref 13.0–17.0)
MCH: 29.8 pg (ref 26.0–34.0)
MCHC: 34 g/dL (ref 30.0–36.0)
MCV: 87.8 fL (ref 80.0–100.0)
Platelets: 216 10*3/uL (ref 150–400)
RBC: 5.73 MIL/uL (ref 4.22–5.81)
RDW: 12.6 % (ref 11.5–15.5)
WBC: 7.2 10*3/uL (ref 4.0–10.5)
nRBC: 0 % (ref 0.0–0.2)

## 2020-06-21 LAB — GASTROINTESTINAL PANEL BY PCR, STOOL (REPLACES STOOL CULTURE)
Adenovirus F40/41: NOT DETECTED
Astrovirus: NOT DETECTED
Campylobacter species: NOT DETECTED
Cryptosporidium: NOT DETECTED
Cyclospora cayetanensis: NOT DETECTED
Entamoeba histolytica: NOT DETECTED
Enteroaggregative E coli (EAEC): DETECTED — AB
Enteropathogenic E coli (EPEC): NOT DETECTED
Enterotoxigenic E coli (ETEC): NOT DETECTED
Giardia lamblia: NOT DETECTED
Norovirus GI/GII: DETECTED — AB
Plesimonas shigelloides: NOT DETECTED
Rotavirus A: NOT DETECTED
Salmonella species: NOT DETECTED
Sapovirus (I, II, IV, and V): NOT DETECTED
Shiga like toxin producing E coli (STEC): NOT DETECTED
Shigella/Enteroinvasive E coli (EIEC): NOT DETECTED
Vibrio cholerae: NOT DETECTED
Vibrio species: NOT DETECTED
Yersinia enterocolitica: NOT DETECTED

## 2020-06-21 LAB — URINALYSIS, COMPLETE (UACMP) WITH MICROSCOPIC
Bacteria, UA: NONE SEEN
Bilirubin Urine: NEGATIVE
Glucose, UA: NEGATIVE mg/dL
Ketones, ur: NEGATIVE mg/dL
Leukocytes,Ua: NEGATIVE
Nitrite: NEGATIVE
Protein, ur: NEGATIVE mg/dL
Specific Gravity, Urine: 1.024 (ref 1.005–1.030)
Squamous Epithelial / HPF: NONE SEEN (ref 0–5)
pH: 5 (ref 5.0–8.0)

## 2020-06-21 LAB — COMPREHENSIVE METABOLIC PANEL
ALT: 36 U/L (ref 0–44)
AST: 24 U/L (ref 15–41)
Albumin: 4.3 g/dL (ref 3.5–5.0)
Alkaline Phosphatase: 70 U/L (ref 38–126)
Anion gap: 9 (ref 5–15)
BUN: 13 mg/dL (ref 6–20)
CO2: 22 mmol/L (ref 22–32)
Calcium: 8.6 mg/dL — ABNORMAL LOW (ref 8.9–10.3)
Chloride: 106 mmol/L (ref 98–111)
Creatinine, Ser: 0.81 mg/dL (ref 0.61–1.24)
GFR, Estimated: >60 mL/min (ref >60)
Glucose, Bld: 101 mg/dL — ABNORMAL HIGH (ref 70–99)
Potassium: 3.4 mmol/L — ABNORMAL LOW (ref 3.5–5.1)
Sodium: 137 mmol/L (ref 135–145)
Total Bilirubin: 0.5 mg/dL (ref 0.3–1.2)
Total Protein: 7 g/dL (ref 6.5–8.1)

## 2020-06-21 LAB — C DIFFICILE QUICK SCREEN W PCR REFLEX
C Diff antigen: NEGATIVE
C Diff interpretation: NOT DETECTED
C Diff toxin: NEGATIVE

## 2020-06-21 LAB — LIPASE, BLOOD: Lipase: 25 U/L (ref 11–51)

## 2020-06-21 MED ORDER — IBUPROFEN 800 MG PO TABS
800.0000 mg | ORAL_TABLET | Freq: Three times a day (TID) | ORAL | 0 refills | Status: AC | PRN
Start: 2020-06-21 — End: ?

## 2020-06-21 MED ORDER — AZITHROMYCIN 500 MG PO TABS
500.0000 mg | ORAL_TABLET | Freq: Once | ORAL | Status: AC
  Administered 2020-06-21: 500 mg via ORAL
  Filled 2020-06-21: qty 1

## 2020-06-21 MED ORDER — IBUPROFEN 600 MG PO TABS
600.0000 mg | ORAL_TABLET | Freq: Once | ORAL | Status: AC
  Administered 2020-06-21: 600 mg via ORAL
  Filled 2020-06-21: qty 1

## 2020-06-21 MED ORDER — ONDANSETRON 4 MG PO TBDP: 4.0000 mg | ORAL_TABLET | Freq: Three times a day (TID) | ORAL | 0 refills | Status: AC | PRN

## 2020-06-21 MED ORDER — AZITHROMYCIN 250 MG PO TABS
ORAL_TABLET | ORAL | 0 refills | Status: DC
Start: 2020-06-21 — End: 2020-06-27

## 2020-06-21 NOTE — ED Triage Notes (Signed)
Pt reports mid abd pain with loose stools. Pt also reports "sulfur burps." states he traveled to [place] 2/15/3/15/22 and had a "stomach infection" while there which he was treated for. States that stomach hasn't been right since. Reports mild headaches and back aches as well intermittently without injury or trauma.

## 2020-06-21 NOTE — ED Provider Notes (Addendum)
[facility] Emergency Department Provider Note  ____________________________________________  Time seen: Approximately 4:38 AM  I have reviewed the triage vital signs and the nursing notes.   HISTORY  Chief Complaint Abdominal Pain   HPI Manuel [name] is a 25 y.o. male who presents for evaluation of abdominal pain and diarrhea.  Patient reports that he spent a month in [place] and returned 3 weeks ago.  While there he had abdominal pain and diarrhea.  He was treated by a local doctor with a short of steroids.  He reports that he fully recovered.  He started having similar symptoms a few days ago.  He reports that he has watery diarrhea all day long with no blood or melena.  He complains of cramping abdominal pain that is associated with the diarrhea and radiates to his back.  He denies nausea or vomiting, fever or chills.   Past Medical History:  Diagnosis Date  . 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 Complication of anesthesia   . Headache    migraines    Patient Active Problem List   Diagnosis Date Noted  . Mass of left testicle 12/27/2016    Past Surgical History:  Procedure Laterality Date  . ORCHIECTOMY Left 12/31/2016   Procedure: RADICAL INGUINAL ORCHIECTOMY;  Surgeon: Nickie Retort, MD;  Location: [facility] ORS;  Service: Urology;  Laterality: Left;  . TONSILLECTOMY AND ADENOIDECTOMY  2009    Prior to Admission medications   Medication Sig Start Date End Date Taking? Authorizing Provider  azithromycin (ZITHROMAX) 250 MG tablet Take 1 a day for 4 days 06/21/20  Yes Alfred Levins, [place], MD  ibuprofen (ADVIL) 800 MG tablet Take 1 tablet (800 mg total) by mouth every 8 (eight) hours as needed. 06/21/20  Yes Alfred Levins, [place], MD  ondansetron (ZOFRAN ODT) 4 MG disintegrating tablet Take 1 tablet (4 mg total) by mouth every 8 (eight) hours as needed. 06/21/20  Yes Mayeli Bornhorst, [place], MD  citalopram (CELEXA) 20 MG tablet Take 1 tablet (20 mg total) by  mouth daily. May increase to 40mg  (2 tabs) after 2 weeks if needed 02/23/19   Mar Daring, PA-C    Allergies Other  Family History  Problem Relation Age of Onset  . Healthy Mother   . Healthy Father   . Healthy Brother     Social History Social History   Tobacco Use  . Smoking status: Former Smoker    Types: E-cigarettes    Quit date: 05/16/2017    Years since quitting: 3.1  . Smokeless tobacco: Never Used  . Tobacco comment: vapes daily  Vaping Use  . Vaping Use: Every day  Substance Use Topics  . Alcohol use: Yes    Alcohol/week: 0.0 standard drinks    Comment: occ @ parties  . Drug use: No    Review of Systems  Constitutional: Negative for fever. Eyes: Negative for visual changes. ENT: Negative for sore throat. Neck: No neck pain  Cardiovascular: Negative for chest pain. Respiratory: Negative for shortness of breath. Gastrointestinal: Negative for abdominal pain, vomiting. + diarrhea. Genitourinary: Negative for dysuria. Musculoskeletal: Negative for back pain. Skin: Negative for rash. Neurological: Negative for headaches, weakness or numbness. Psych: No SI or HI  ____________________________________________   PHYSICAL EXAM:  VITAL SIGNS: ED Triage Vitals  Enc Vitals Group     BP 06/21/20 0036 (!) 133/105     Pulse Rate 06/21/20 0036 96     Resp 06/21/20 0036 18  Temp --      Temp Source 06/21/20 0036 Oral     SpO2 06/21/20 0036 98 %     Weight 06/21/20 0037 215 lb (97.5 kg)     Height 06/21/20 0037 5\' 11"  (1.803 m)     Head Circumference --      Peak Flow --      Pain Score 06/21/20 0036 8     Pain Loc --      Pain Edu? --      Excl. in [place]? --     Constitutional: Alert and oriented. Well appearing and in no apparent distress. HEENT:      Head: Normocephalic and atraumatic.         Eyes: Conjunctivae are normal. Sclera is non-icteric.       Mouth/Throat: Mucous membranes are moist.       Neck: Supple with no signs of  meningismus. Cardiovascular: Regular rate and rhythm. No murmurs, gallops, or rubs.  Respiratory: Normal respiratory effort. Lungs are clear to auscultation bilaterally.  Gastrointestinal: Soft, non tender, and non distended with positive bowel sounds. No rebound or guarding. Genitourinary: No CVA tenderness. Musculoskeletal:  No edema, cyanosis, or erythema of extremities. Neurologic: Normal speech and language. Face is symmetric. Moving all extremities. No gross focal neurologic deficits are appreciated. Skin: Skin is warm, dry and intact. No rash noted. Psychiatric: Mood and affect are normal. Speech and behavior are normal.  ____________________________________________   LABS (all labs ordered are listed, but only abnormal results are displayed)  Labs Reviewed  GASTROINTESTINAL PANEL BY PCR, STOOL (REPLACES STOOL CULTURE) - Abnormal; Notable for the following components:      Result Value   Enteroaggregative E coli (EAEC) DETECTED (*)    Norovirus GI/GII DETECTED (*)    All other components within normal limits  COMPREHENSIVE METABOLIC PANEL - Abnormal; Notable for the following components:   Potassium 3.4 (*)    Glucose, Bld 101 (*)    Calcium 8.6 (*)    All other components within normal limits  CBC - Abnormal; Notable for the following components:   Hemoglobin 17.1 (*)    All other components within normal limits  URINALYSIS, COMPLETE (UACMP) WITH MICROSCOPIC - Abnormal; Notable for the following components:   Color, Urine YELLOW (*)    APPearance CLEAR (*)    Hgb urine dipstick SMALL (*)    All other components within normal limits  C DIFFICILE QUICK SCREEN W PCR REFLEX  GIARDIA, EIA; OVA/PARASITE  LIPASE, BLOOD   ____________________________________________  EKG  none  ____________________________________________  RADIOLOGY  none  ____________________________________________   PROCEDURES  Procedure(s) performed: None Procedures Critical Care  performed:  None ____________________________________________   INITIAL IMPRESSION / ASSESSMENT AND PLAN / ED COURSE   25 y.o. male who presents for evaluation of abdominal pain and diarrhea.  Recently traveled to [place] and had similar symptoms there.  He is otherwise well-appearing in no distress with normal vital signs, afebrile, abdomen is soft with no significant tenderness throughout, positive bowel sounds and no distention.  Labs show no significant electrolyte derangements or signs of dehydration, no leukocytosis or signs of sepsis.  Due to recent travel to [place] will send stool for culture, O&P, Giardia, and C. Diff testing. Will hold off treating with abx until the results are back especially since patient has no fever, is non toxic appearing, has no abd tenderness, or abnormal labs.   _________________________ 6:14 AM on 06/21/2020 -----------------------------------------  C. difficile negative.  Stool cultures  showing norovirus and Enteroaggregative E. Coli, will treat with azithromycin per literature recommendation. Giardia, O&P pending.  Will discharge home azithromycin, on supportive care and contact patient once the results of his stool study are back       _____________________________________________ Please note:  Patient was evaluated in Emergency Department today for the symptoms described in the history of present illness. Patient was evaluated in the context of the global COVID-19 pandemic, which necessitated consideration that the patient might be at risk for infection with the SARS-CoV-2 virus that causes COVID-19. Institutional protocols and algorithms that pertain to the evaluation of patients at risk for COVID-19 are in a state of rapid change based on information released by regulatory bodies including the CDC and federal and state organizations. These policies and algorithms were followed during the patient's care in the ED.  Some ED evaluations and interventions  may be delayed as a result of limited staffing during the pandemic.   [place] Controlled Substance Database was reviewed by me. ____________________________________________   FINAL CLINICAL IMPRESSION(S) / ED DIAGNOSES   Final diagnoses:  Diarrhea of presumed infectious origin  E. coli colitis  Norovirus      NEW MEDICATIONS STARTED DURING THIS VISIT:  ED Discharge Orders         Ordered    ondansetron (ZOFRAN ODT) 4 MG disintegrating tablet  Every 8 hours PRN        06/21/20 0614    ibuprofen (ADVIL) 800 MG tablet  Every 8 hours PRN        06/21/20 0614    azithromycin (ZITHROMAX) 250 MG tablet        06/21/20 7062           Note:  This document was prepared using Dragon voice recognition software and may include unintentional dictation errors.    Alfred Levins, [place], MD 06/21/20 [place], [place], MD 06/21/20 [phone]

## 2020-06-22 LAB — GIARDIA, EIA; OVA/PARASITE

## 2020-06-24 ENCOUNTER — Telehealth: Payer: Self-pay | Admitting: Physician Assistant

## 2020-06-24 ENCOUNTER — Encounter: Payer: Self-pay | Admitting: Physician Assistant

## 2020-06-24 DIAGNOSIS — A09 Infectious gastroenteritis and colitis, unspecified: Secondary | ICD-10-CM

## 2020-06-24 NOTE — Progress Notes (Signed)
Manuel [name], Manuel [name] are scheduled for a virtual visit with your provider today.    Just as we do with appointments in the office, we must obtain your consent to participate.  Your consent will be active for this visit and any virtual visit you may have with one of our providers in the next 365 days.    If you have a MyChart account, I can also send a copy of this consent to you electronically.  All virtual visits are billed to your insurance company just like a traditional visit in the office.  As this is a virtual visit, video technology does not allow for your provider to perform a traditional examination.  This may limit your provider's ability to fully assess your condition.  If your provider identifies any concerns that need to be evaluated in person or the need to arrange testing such as labs, EKG, etc, we will make arrangements to do so.    Although advances in technology are sophisticated, we cannot ensure that it will always work on either your end or our end.  If the connection with a video visit is poor, we may have to switch to a telephone visit.  With either a video or telephone visit, we are not always able to ensure that we have a secure connection.   I need to obtain your verbal consent now.   Are you willing to proceed with your visit today?   Manuel [name] has provided verbal consent on 06/24/2020 for a virtual visit (video or telephone).   Mar Daring, PA-C 06/24/2020  2:15 PM   MyChart Video Visit    Virtual Visit via Video Note   This visit type was conducted due to national recommendations for restrictions regarding the COVID-19 Pandemic (e.g. social distancing) in an effort to limit this patient's exposure and mitigate transmission in our community. This patient is at least at moderate risk for complications without adequate follow up. This format is felt to be most appropriate for this patient at this time. Physical exam was limited by quality of the video and  audio technology used for the visit.   Patient location: Home Provider location: Home office in [place]  I discussed the limitations of evaluation and management by telemedicine and the availability of in person appointments. The patient expressed understanding and agreed to proceed.  Patient: Manuel [name]   DOB: Oct 12, 1995   25 y.o. Male  MRN: [id number] Visit Date: 06/24/2020  Today's healthcare provider: Mar Daring, PA-C   No chief complaint on file.  Subjective    Diarrhea  This is a recurrent problem. The current episode started in the past 7 days. The problem occurs 2 to 4 times per day. The problem has been gradually improving. The patient states that diarrhea does not awaken him from sleep. Associated symptoms include abdominal pain. Risk factors include travel to endemic area. Treatments tried: antibiotic treatment for e.coli infection. The treatment provided significant relief.    Patient had traveled to [place] and developed significant diarrhea when he returned. He was seen at [facility] ER on Saturday 06/21/20 for this issue and was diagnosed with noravirus and e.coli. He was treated with azithromycin. Reports yesterday he was still having cramping and loose stools and missed work. Today feels so much better. Just requires work note.  Patient Active Problem List   Diagnosis Date Noted  . Mass of left testicle 12/27/2016   Past Medical History:  Diagnosis Date  . Cancer ([place])  testicular   . Complication of anesthesia   . Headache    migraines      Medications: Outpatient Medications Prior to Visit  Medication Sig  . azithromycin (ZITHROMAX) 250 MG tablet Take 1 a day for 4 days  . citalopram (CELEXA) 20 MG tablet Take 1 tablet (20 mg total) by mouth daily. May increase to 40mg  (2 tabs) after 2 weeks if needed  . ibuprofen (ADVIL) 800 MG tablet Take 1 tablet (800 mg total) by mouth every 8 (eight) hours as needed.  . ondansetron (ZOFRAN ODT) 4 MG  disintegrating tablet Take 1 tablet (4 mg total) by mouth every 8 (eight) hours as needed.   No facility-administered medications prior to visit.    Review of Systems  Constitutional: Negative.   Respiratory: Negative.   Cardiovascular: Negative.   Gastrointestinal: Positive for abdominal pain and diarrhea.       All improving     Last CBC Lab Results  Component Value Date   WBC 7.2 06/21/2020   HGB 17.1 (H) 06/21/2020   HCT 50.3 06/21/2020   MCV 87.8 06/21/2020   MCH 29.8 06/21/2020   RDW 12.6 06/21/2020   PLT 216 26/20/3559   Last metabolic panel Lab Results  Component Value Date   GLUCOSE 101 (H) 06/21/2020   NA 137 06/21/2020   K 3.4 (L) 06/21/2020   CL 106 06/21/2020   CO2 22 06/21/2020   BUN 13 06/21/2020   CREATININE 0.81 06/21/2020   GFRNONAA >60 06/21/2020   GFRAA >60 09/02/2019   CALCIUM 8.6 (L) 06/21/2020   PROT 7.0 06/21/2020   ALBUMIN 4.3 06/21/2020   LABGLOB 2.1 12/28/2016   AGRATIO 2.0 12/28/2016   BILITOT 0.5 06/21/2020   ALKPHOS 70 06/21/2020   AST 24 06/21/2020   ALT 36 06/21/2020   ANIONGAP 9 06/21/2020      Objective    There were no vitals taken for this visit. BP Readings from Last 3 Encounters:  06/21/20 120/79  09/02/19 124/71  02/23/19 139/82   Wt Readings from Last 3 Encounters:  06/21/20 215 lb (97.5 kg)  09/02/19 205 lb (93 kg)  02/23/19 212 lb (96.2 kg)      Physical Exam Vitals reviewed.  Constitutional:      General: He is not in acute distress.    Appearance: Normal appearance. He is well-developed. He is not ill-appearing.  HENT:     Head: Normocephalic and atraumatic.  Eyes:     Conjunctiva/sclera: Conjunctivae normal.  Pulmonary:     Effort: Pulmonary effort is normal. No respiratory distress.  Musculoskeletal:     Cervical back: Normal range of motion and neck supple.  Neurological:     Mental Status: He is alert.  Psychiatric:        Mood and Affect: Mood normal.        Behavior: Behavior normal.         Thought Content: Thought content normal.        Judgment: Judgment normal.        Assessment & Plan     1. Diarrhea of infectious origin - Continue ABX until completed - Push fluids and increase diet as tolerated - Can add probiotics  - Work note provided via Pharmacist, community   No follow-ups on file.     I discussed the assessment and treatment plan with the patient. The patient was provided an opportunity to ask questions and all were answered. The patient agreed with the plan and demonstrated an understanding  of the instructions.   The patient was advised to call back or seek an in-person evaluation if the symptoms worsen or if the condition fails to improve as anticipated.  I provided 10 minutes of face-to-face time during this encounter via MyChart Video enabled encounter.  [place] [phone] (phone) [phone] (fax)  [place]

## 2020-06-27 ENCOUNTER — Encounter: Payer: Self-pay | Admitting: Physician Assistant

## 2020-06-27 ENCOUNTER — Telehealth: Payer: Self-pay | Admitting: Physician Assistant

## 2020-06-27 DIAGNOSIS — A044 Other intestinal Escherichia coli infections: Secondary | ICD-10-CM

## 2020-06-27 MED ORDER — CIPROFLOXACIN HCL 500 MG PO TABS: 500.0000 mg | ORAL_TABLET | Freq: Two times a day (BID) | ORAL | 0 refills | Status: AC

## 2020-06-27 NOTE — Patient Instructions (Signed)
Food Choices to Help Relieve Diarrhea, Adult Diarrhea can make you feel weak and cause you to become dehydrated. It is important to choose the right foods and drinks to:  Relieve diarrhea.  Replace lost fluids and nutrients.  Prevent dehydration. What are tips for following this plan? Relieving diarrhea  Avoid foods that make your diarrhea worse. These may include: ? Foods and beverages sweetened with high-fructose corn syrup, honey, or sweeteners such as xylitol, sorbitol, and mannitol. ? Fried, greasy, or spicy foods. ? Raw fruits and vegetables.  Eat foods that are rich in probiotics. These include foods such as yogurt and fermented milk products. Probiotics can help increase healthy bacteria in your stomach and intestines (gastrointestinal tract or GI tract). This may help digestion and stop diarrhea.  If you have lactose intolerance, avoid dairy products. These may make your diarrhea worse.  Take medicine to help stop diarrhea only as told by your health care provider. Replacing nutrients  Eat bland, easy-to-digest foods in small amounts as you are able, until your diarrhea starts to get better. These foods include bananas, applesauce, rice, toast, and crackers.  Gradually reintroduce nutrient-rich foods as tolerated or as told by your health care provider. This includes: ? Well-cooked protein foods, such as eggs, lean meats like fish or chicken without skin, and tofu. ? Peeled, seeded, and soft-cooked fruits and vegetables. ? Low-fat dairy products. ? Whole grains.  Take vitamin and mineral supplements as told by your health care provider.   Preventing dehydration  Start by sipping water or a solution to prevent dehydration (oral rehydration solution, ORS). This is a drink that helps replace fluids and minerals your body has lost. You can buy an ORS at pharmacies and retail stores.  Try to drink at least 8-10 cups (2,000-2,500 mL) of fluid each day to help replace lost  fluids. If you have urine that is pale yellow, you are getting enough fluids.  You may drink other liquids in addition to water, such as fruit juice that you have added water to (diluted fruit juice) or low-calorie sports drinks, as tolerated or as told by your health care provider.  Avoid drinks with caffeine, such as coffee, tea, or soft drinks.  Avoid alcohol.   Summary  When you have diarrhea, it is important to choose the right foods and drinks to relieve diarrhea, to replace lost fluids and nutrients, and to prevent dehydration.  Make sure you drink enough fluid to keep your urine pale yellow.  You may benefit from eating bland foods at first. Gradually reintroduce healthy, nutrient-rich foods as tolerated or as told by your health care provider.  Avoid foods that make your diarrhea worse, such as fried, greasy, or spicy foods. This information is not intended to replace advice given to you by your health care provider. Make sure you discuss any questions you have with your health care provider. Document Revised: 04/17/2019 Document Reviewed: 04/17/2019 Elsevier Patient Education  2021 [employer].

## 2020-06-27 NOTE — Progress Notes (Signed)
[name], [name] are scheduled for a virtual visit with your provider today.    Just as we do with appointments in the office, we must obtain your consent to participate.  Your consent will be active for this visit and any virtual visit you may have with one of our providers in the next 365 days.    If you have a MyChart account, I can also send a copy of this consent to you electronically.  All virtual visits are billed to your insurance company just like a traditional visit in the office.  As this is a virtual visit, video technology does not allow for your provider to perform a traditional examination.  This may limit your provider's ability to fully assess your condition.  If your provider identifies any concerns that need to be evaluated in person or the need to arrange testing such as labs, EKG, etc, we will make arrangements to do so.    Although advances in technology are sophisticated, we cannot ensure that it will always work on either your end or our end.  If the connection with a video visit is poor, we may have to switch to a telephone visit.  With either a video or telephone visit, we are not always able to ensure that we have a secure connection.   I need to obtain your verbal consent now.   Are you willing to proceed with your visit today?   Manuel [name] has provided verbal consent on 06/27/2020 for a virtual visit (video or telephone).   Mar Daring, PA-C 06/27/2020  11:20 AM   MyChart Video Visit    Virtual Visit via Video Note   This visit type was conducted due to national recommendations for restrictions regarding the COVID-19 Pandemic (e.g. social distancing) in an effort to limit this patient's exposure and mitigate transmission in our community. This patient is at least at moderate risk for complications without adequate follow up. This format is felt to be most appropriate for this patient at this time. Physical exam was limited by quality of the video and  audio technology used for the visit.   Patient location: Home  Provider location: Home office in [place]  I discussed the limitations of evaluation and management by telemedicine and the availability of in person appointments. The patient expressed understanding and agreed to proceed.  Patient: Manuel [name]   DOB: [birth year]   25 y.o. Male  MRN: [id number] Visit Date: 06/27/2020  Today's healthcare provider: Mar Daring, PA-C   No chief complaint on file.  Subjective    HPI  Manuel [name] is a 25 yr old male that presents today via [facility] for complaint of worsening diarrhea. He was seen in the ER on 06/21/2020 for the same issue and was diagnosed with Noravirus and E.Coli infection. He had been in [place] when symptoms had started. He was started on azithromycin 250mg  x 4 days. He was seen virtually on 06/24/20 and was improving. He reports yesterday was the last day of the antibiotic. This morning he awoke with abdominal cramping, indigestion, brash, and frequent, watery diarrhea again. He reports this morning there was one instance where he almost had fecal incontinence. He denies fevers, chills, nausea, vomiting, weight loss, night sweats, melena, or hematochezia.  Patient Active Problem List   Diagnosis Date Noted  . Mass of left testicle 12/27/2016   Past Medical History:  Diagnosis Date  . 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 Complication of  anesthesia   . Headache    migraines      Medications: Outpatient Medications Prior to Visit  Medication Sig  . azithromycin (ZITHROMAX) 250 MG tablet Take 1 a day for 4 days  . citalopram (CELEXA) 20 MG tablet Take 1 tablet (20 mg total) by mouth daily. May increase to 40mg  (2 tabs) after 2 weeks if needed  . ibuprofen (ADVIL) 800 MG tablet Take 1 tablet (800 mg total) by mouth every 8 (eight) hours as needed.  . ondansetron (ZOFRAN ODT) 4 MG disintegrating tablet Take 1 tablet (4 mg total) by mouth every 8 (eight)  hours as needed.   No facility-administered medications prior to visit.    Review of Systems  Constitutional: Negative for chills, diaphoresis, fatigue, fever and unexpected weight change.  HENT: Negative.   Respiratory: Negative.  Negative for cough, chest tightness and shortness of breath.   Cardiovascular: Negative.  Negative for chest pain.  Gastrointestinal: Positive for abdominal distention, abdominal pain and diarrhea. Negative for anal bleeding, blood in stool, constipation, nausea, rectal pain and vomiting.  Neurological: Negative for dizziness and headaches.    Last CBC Lab Results  Component Value Date   WBC 7.2 06/21/2020   HGB 17.1 (H) 06/21/2020   HCT 50.3 06/21/2020   MCV 87.8 06/21/2020   MCH 29.8 06/21/2020   RDW 12.6 06/21/2020   PLT 216 37/90/2409   Last metabolic panel Lab Results  Component Value Date   GLUCOSE 101 (H) 06/21/2020   NA 137 06/21/2020   K 3.4 (L) 06/21/2020   CL 106 06/21/2020   CO2 22 06/21/2020   BUN 13 06/21/2020   CREATININE 0.81 06/21/2020   GFRNONAA >60 06/21/2020   GFRAA >60 09/02/2019   CALCIUM 8.6 (L) 06/21/2020   PROT 7.0 06/21/2020   ALBUMIN 4.3 06/21/2020   LABGLOB 2.1 12/28/2016   AGRATIO 2.0 12/28/2016   BILITOT 0.5 06/21/2020   ALKPHOS 70 06/21/2020   AST 24 06/21/2020   ALT 36 06/21/2020   ANIONGAP 9 06/21/2020      Objective    There were no vitals taken for this visit. BP Readings from Last 3 Encounters:  06/21/20 120/79  09/02/19 124/71  02/23/19 139/82   Wt Readings from Last 3 Encounters:  06/21/20 215 lb (97.5 kg)  09/02/19 205 lb (93 kg)  02/23/19 212 lb (96.2 kg)      Physical Exam Vitals reviewed.  Constitutional:      General: He is not in acute distress.    Appearance: Normal appearance. He is well-developed. He is not ill-appearing.  HENT:     Head: Normocephalic and atraumatic.  Eyes:     Conjunctiva/sclera: Conjunctivae normal.  Pulmonary:     Effort: Pulmonary effort is normal.  No respiratory distress.  Musculoskeletal:     Cervical back: Normal range of motion and neck supple.  Neurological:     Mental Status: He is alert.  Psychiatric:        Mood and Affect: Mood normal.        Behavior: Behavior normal.        Thought Content: Thought content normal.        Judgment: Judgment normal.       Assessment & Plan     1. E coli enteritis - Had been improving, but symptoms returned once azithromycin completed - Will change to Cipro as below for better coverage - Discussed BRAT diet, bland foods, increasing as tolerated - Add omeprazole (Prilosec) OTC x 14  days - Add probiotic - Push fluids to stay hydrated - Work note provided - Discussed if not improving needs to be seen in person and possible GI referral - ciprofloxacin (CIPRO) 500 MG tablet; Take 1 tablet (500 mg total) by mouth 2 (two) times daily.  Dispense: 10 tablet; Refill: 0   No follow-ups on file.     I discussed the assessment and treatment plan with the patient. The patient was provided an opportunity to ask questions and all were answered. The patient agreed with the plan and demonstrated an understanding of the instructions.   The patient was advised to call back or seek an in-person evaluation if the symptoms worsen or if the condition fails to improve as anticipated.  I provided 15 minutes of face-to-face time during this encounter via MyChart Video enabled encounter.   [place]ye [place] Smyer [phone] (phone) [phone] (fax)  [place]

## 2020-07-07 ENCOUNTER — Inpatient Hospital Stay: Payer: Self-pay | Admitting: Family Medicine

## 2020-07-07 NOTE — Progress Notes (Deleted)
      Established patient visit   Patient: Manuel [name]   DOB: 1995/12/18   25 y.o. Male  MRN: [id number] Visit Date: 07/07/2020  Today's healthcare provider: Lelon Huh, MD   No chief complaint on file.  Subjective    HPI  Follow up ER visit  Patient was seen in ER for diarrhea on 06/21/2020. He was treated for ***. Treatment for this included ordering stool studies. C. difficile was negative.  Stool cultures showed norovirus and Enteroaggregative E. Coli. Patient was treated with azithromycin per literature recommendation. Giardia, O&P pending. Patient was discharged home with azithromycin and supportive care. Since ER visit patient has had 2 virtual video visits with [facility] health tele health. The last video visit was 06/27/2020. During that visit, antibiotic was changed to Cipro for better coverage. Discussed BRAT diet, bland foods, increasing as tolerated. Add omeprazole (Prilosec) OTC x 14 days and probiotic. Patient was advised to push fluids to stay hydrated. He reports {DESC; EXCELLENT/GOOD/FAIR:19992} compliance with treatment. He reports this condition is {improved/worse/unchanged:3041574}.  -----------------------------------------------------------------------------------------   {Show patient history (optional):23778::" "}   Medications: Outpatient Medications Prior to Visit  Medication Sig  . ciprofloxacin (CIPRO) 500 MG tablet Take 1 tablet (500 mg total) by mouth 2 (two) times daily.  . citalopram (CELEXA) 20 MG tablet Take 1 tablet (20 mg total) by mouth daily. May increase to 40mg  (2 tabs) after 2 weeks if needed  . ibuprofen (ADVIL) 800 MG tablet Take 1 tablet (800 mg total) by mouth every 8 (eight) hours as needed.  . ondansetron (ZOFRAN ODT) 4 MG disintegrating tablet Take 1 tablet (4 mg total) by mouth every 8 (eight) hours as needed.   No facility-administered medications prior to visit.    Review of Systems  {Labs  Heme  Chem  Endocrine   Serology  Results Review (optional):23779::" "}   Objective    There were no vitals taken for this visit. {Show previous vital signs (optional):23777::" "}   Physical Exam  ***  No results found for any visits on 07/07/20.  Assessment & Plan     ***  No follow-ups on file.      {provider attestation***:1}   Lelon Huh, MD  [facility] [phone] (phone) [phone] (fax)  [place]

## 2020-07-07 NOTE — Patient Instructions (Incomplete)
.   Please review the attached list of medications and notify my office if there are any errors.   . Please bring all of your medications to every appointment so we can make sure that our medication list is the same as yours.   

## 2022-01-11 ENCOUNTER — Other Ambulatory Visit: Payer: Self-pay

## 2023-01-11 ENCOUNTER — Encounter: Payer: Self-pay | Admitting: Physician Assistant

## 2024-03-13 ENCOUNTER — Emergency Department
Admission: EM | Admit: 2024-03-13 | Discharge: 2024-03-13 | Disposition: A | Discharge status: Home / Self Care | Payer: Self-pay | Attending: Emergency Medicine | Admitting: Emergency Medicine

## 2024-03-13 DIAGNOSIS — R109 Unspecified abdominal pain: Secondary | ICD-10-CM | POA: Insufficient documentation

## 2024-03-13 DIAGNOSIS — R197 Diarrhea, unspecified: Secondary | ICD-10-CM | POA: Insufficient documentation

## 2024-03-13 LAB — CBC
HCT: 52.1 % — ABNORMAL HIGH (ref 39.0–52.0)
Hemoglobin: 17.6 g/dL — ABNORMAL HIGH (ref 13.0–17.0)
MCH: 29.6 pg (ref 26.0–34.0)
MCHC: 33.8 g/dL (ref 30.0–36.0)
MCV: 87.7 fL (ref 80.0–100.0)
Platelets: 246 K/uL (ref 150–400)
RBC: 5.94 MIL/uL — ABNORMAL HIGH (ref 4.22–5.81)
RDW: 12.7 % (ref 11.5–15.5)
WBC: 7 K/uL (ref 4.0–10.5)
nRBC: 0 % (ref 0.0–0.2)

## 2024-03-13 LAB — URINALYSIS, ROUTINE W REFLEX MICROSCOPIC
Bacteria, UA: NONE SEEN
Bilirubin Urine: NEGATIVE
Glucose, UA: NEGATIVE mg/dL
Ketones, ur: NEGATIVE mg/dL
Leukocytes,Ua: NEGATIVE
Nitrite: NEGATIVE
Protein, ur: NEGATIVE mg/dL
Specific Gravity, Urine: 1.034 — ABNORMAL HIGH (ref 1.005–1.030)
Squamous Epithelial / HPF: 0 /HPF (ref 0–5)
pH: 5 (ref 5.0–8.0)

## 2024-03-13 LAB — COMPREHENSIVE METABOLIC PANEL WITH GFR
ALT: 72 U/L — ABNORMAL HIGH (ref 0–44)
AST: 30 U/L (ref 15–41)
Albumin: 4.6 g/dL (ref 3.5–5.0)
Alkaline Phosphatase: 90 U/L (ref 38–126)
Anion gap: 11 (ref 5–15)
BUN: 14 mg/dL (ref 6–20)
CO2: 24 mmol/L (ref 22–32)
Calcium: 9.5 mg/dL (ref 8.9–10.3)
Chloride: 101 mmol/L (ref 98–111)
Creatinine, Ser: 0.95 mg/dL (ref 0.61–1.24)
GFR, Estimated: >60 mL/min
Glucose, Bld: 157 mg/dL — ABNORMAL HIGH (ref 70–99)
Potassium: 4.4 mmol/L (ref 3.5–5.1)
Sodium: 136 mmol/L (ref 135–145)
Total Bilirubin: 0.3 mg/dL (ref 0.0–1.2)
Total Protein: 7.4 g/dL (ref 6.5–8.1)

## 2024-03-13 LAB — LIPASE, BLOOD: Lipase: 16 U/L (ref 11–51)

## 2024-03-13 MED ORDER — ONDANSETRON 4 MG PO TBDP
4.0000 mg | ORAL_TABLET | Freq: Once | ORAL | Status: AC
  Administered 2024-03-13: 4 mg via ORAL
  Filled 2024-03-13: qty 1

## 2024-03-13 NOTE — ED Notes (Signed)
 Pt given DC instructions. Pt verbalized understanding of follow up care. Pt ambulatory from ED without difficulty.

## 2024-03-13 NOTE — ED Triage Notes (Signed)
 Pt presents to the ED via POV from home with fever, diarrhea, and nausea since sunday night. Pt A&Ox4. Ambulatory to triage room.

## 2024-03-13 NOTE — ED Provider Notes (Signed)
 "  [facility] Provider Note    Event Date/Time   First MD Initiated Contact with Patient 03/13/24 [phone]     (approximate)   History   Abdominal Pain   HPI  Manuel [name] is a 28 y.o. male who presents today for evaluation of watery diarrhea for the past 3 days and cramping abdominal pain.  Patient denies any blood in his stool.  Reports he feels nauseated has not had any vomiting.  No known sick contacts.  No recent travel.  No recent antibiotic use.  Patient Active Problem List   Diagnosis Date Noted   Mass of left testicle 12/27/2016          Physical Exam   Triage Vital Signs: ED Triage Vitals [03/13/24 0931]  Encounter Vitals Group     BP (!) 153/107     Girls Systolic BP Percentile      Girls Diastolic BP Percentile      Boys Systolic BP Percentile      Boys Diastolic BP Percentile      Pulse Rate 80     Resp 18     Temp 97.8 F (36.6 C)     Temp Source Oral     SpO2 99 %     Weight 220 lb (99.8 kg)     Height 5' 11 (1.803 m)     Head Circumference      Peak Flow      Pain Score 7     Pain Loc      Pain Education      Exclude from Growth Chart     Most recent vital signs: Vitals:   03/13/24 1024 03/13/24 1136  BP:  124/85  Pulse:  77  Resp:  17  Temp:  98 F (36.7 C)  SpO2: 100% 100%    Physical Exam Vitals and nursing note reviewed.  Constitutional:      General: Awake and alert. No acute distress.    Appearance: Normal appearance. The patient is normal weight.  HENT:     Head: Normocephalic and atraumatic.     Mouth: Mucous membranes are moist.  Eyes:     General: PERRL. Normal EOMs        Right eye: No discharge.        Left eye: No discharge.     Conjunctiva/sclera: Conjunctivae normal.  Cardiovascular:     Rate and Rhythm: Normal rate and regular rhythm.     Pulses: Normal pulses.  Pulmonary:     Effort: Pulmonary effort is normal. No respiratory distress.     Breath sounds: Normal breath  sounds.  Abdominal:     Abdomen is soft. There is no abdominal tenderness. No rebound or guarding. No distention. Musculoskeletal:        General: No swelling. Normal range of motion.     Cervical back: Normal range of motion and neck supple.  Skin:    General: Skin is warm and dry.     Capillary Refill: Capillary refill takes less than 2 seconds.     Findings: No rash.  Neurological:     Mental Status: The patient is awake and alert.      ED Results / Procedures / Treatments   Labs (all labs ordered are listed, but only abnormal results are displayed) Labs Reviewed  COMPREHENSIVE METABOLIC PANEL WITH GFR - Abnormal; Notable for the following components:      Result Value   Glucose, Bld 157 (*)  ALT 72 (*)    All other components within normal limits  CBC - Abnormal; Notable for the following components:   RBC 5.94 (*)    Hemoglobin 17.6 (*)    HCT 52.1 (*)    All other components within normal limits  URINALYSIS, ROUTINE W REFLEX MICROSCOPIC - Abnormal; Notable for the following components:   Color, Urine YELLOW (*)    APPearance CLEAR (*)    Specific Gravity, Urine 1.034 (*)    Hgb urine dipstick SMALL (*)    All other components within normal limits  GASTROINTESTINAL PANEL BY PCR, STOOL (REPLACES STOOL CULTURE)  LIPASE, BLOOD     EKG     RADIOLOGY     PROCEDURES:  Critical Care performed:   Procedures   MEDICATIONS ORDERED IN ED: Medications  ondansetron  (ZOFRAN -ODT) disintegrating tablet 4 mg (4 mg Oral Given 03/13/24 1000)     IMPRESSION / MDM / ASSESSMENT AND PLAN / ED COURSE  I reviewed the triage vital signs and the nursing notes.   Differential diagnosis includes, but is not limited to, gastroenteritis, norovirus, influenza, less likely appendicitis or diverticulitis given lack of abdominal tenderness.  Patient is awake and alert, hemodynamically stable and afebrile.  Labs were obtained in triage and are overall reassuring.  I offered  to do a stool sample, however patient was unable to give a stool sample throughout his emergency department stay.  He was treated symptomatically with Zofran  with good effect  Patient is overall well-appearing. No abdominal pain or tenderness. Vomiting and diarrhea are non-bloody. Patient is hemodynamically stable. No history of immunosuppression, no other red flags such as recent travel, sick contacts or recent antibiotic use. Improved with treatment in the emergency department and tolerating oral intake. Differential diagnosis is broad however without focal abdominal tenderness and given improvement, no concern that the patient requires urgent imaging or has surgical process in abdomen. Given that patient has a paucity of red flags for the vomiting and diarrhea as it pertains to patient's past medical history and history of present illness, no indication for further observation or empiric antibiotic treatment.  Recommended bland foods, hydration, and symptomatic management.  Discussed care plan, return precautions, and advised close outpatient follow-up. Patient agrees with plan of care.   Patient's presentation is most consistent with acute complicated illness / injury requiring diagnostic workup.     FINAL CLINICAL IMPRESSION(S) / ED DIAGNOSES   Final diagnoses:  Diarrhea, unspecified type     Rx / DC Orders   ED Discharge Orders     None        Note:  This document was prepared using Dragon voice recognition software and may include unintentional dictation errors.   Anapaula Severt E, PA-C 03/13/24 1313    Suzanne Kirsch, MD 03/13/24 [phone]  "

## 2024-03-13 NOTE — ED Notes (Signed)
 Pt made 2 attempts to get stool for sample and was unsuccessful. Provider made aware by this RN.

## 2024-03-13 NOTE — Discharge Instructions (Signed)
 Please follow-up with your outpatient provider.  Please stay hydrated and eat bland foods as we discussed.  You are unable to give a stool sample in the emergency department today, though please follow-up with your outpatient provider if your symptoms persist.  Please return for any new, worsening, or changing symptoms or other concerns.  It was a pleasure caring for you today.
